# Patient Record
Sex: Female | Born: 1960 | Race: White | Hispanic: No | Marital: Married | Smoking: Former smoker
Health system: Southern US, Community
[De-identification: ages and names within clinical notes are randomized; demographics above are authoritative.]

## PROBLEM LIST (undated history)

## (undated) DIAGNOSIS — D649 Anemia, unspecified: Secondary | ICD-10-CM

## (undated) DIAGNOSIS — F32A Depression, unspecified: Secondary | ICD-10-CM

## (undated) DIAGNOSIS — F329 Major depressive disorder, single episode, unspecified: Secondary | ICD-10-CM

## (undated) HISTORY — PX: NASAL SINUS SURGERY: SHX719

## (undated) HISTORY — PX: PLACEMENT OF BREAST IMPLANTS: SHX6334

## (undated) HISTORY — PX: ECTOPIC PREGNANCY SURGERY: SHX613

---

## 2004-10-05 ENCOUNTER — Encounter: Admission: RE | Admit: 2004-10-05 | Discharge: 2004-10-05 | Payer: Self-pay | Admitting: Otolaryngology

## 2008-01-04 ENCOUNTER — Encounter: Admission: RE | Admit: 2008-01-04 | Discharge: 2008-01-04 | Payer: Self-pay | Admitting: Otolaryngology

## 2009-06-13 ENCOUNTER — Encounter: Admission: RE | Admit: 2009-06-13 | Discharge: 2009-06-13 | Payer: Self-pay | Admitting: Otolaryngology

## 2011-12-23 ENCOUNTER — Other Ambulatory Visit: Payer: Self-pay | Admitting: Otolaryngology

## 2011-12-23 DIAGNOSIS — Z1231 Encounter for screening mammogram for malignant neoplasm of breast: Secondary | ICD-10-CM

## 2012-01-10 ENCOUNTER — Ambulatory Visit
Admission: RE | Admit: 2012-01-10 | Discharge: 2012-01-10 | Disposition: A | Discharge status: Home / Self Care | Payer: BC Managed Care – PPO | Source: Ambulatory Visit | Attending: Otolaryngology | Admitting: Otolaryngology

## 2012-01-10 DIAGNOSIS — Z1231 Encounter for screening mammogram for malignant neoplasm of breast: Secondary | ICD-10-CM

## 2015-06-30 ENCOUNTER — Inpatient Hospital Stay: Admission: RE | Admit: 2015-06-30 | Payer: Self-pay | Source: Ambulatory Visit

## 2015-06-30 ENCOUNTER — Encounter: Payer: Self-pay | Admitting: *Deleted

## 2015-06-30 DIAGNOSIS — Z881 Allergy status to other antibiotic agents status: Secondary | ICD-10-CM | POA: Diagnosis not present

## 2015-06-30 DIAGNOSIS — N939 Abnormal uterine and vaginal bleeding, unspecified: Secondary | ICD-10-CM | POA: Diagnosis present

## 2015-06-30 DIAGNOSIS — N736 Female pelvic peritoneal adhesions (postinfective): Secondary | ICD-10-CM | POA: Diagnosis not present

## 2015-06-30 DIAGNOSIS — R938 Abnormal findings on diagnostic imaging of other specified body structures: Secondary | ICD-10-CM | POA: Diagnosis not present

## 2015-06-30 DIAGNOSIS — N8320 Unspecified ovarian cysts: Secondary | ICD-10-CM | POA: Diagnosis not present

## 2015-06-30 DIAGNOSIS — G4700 Insomnia, unspecified: Secondary | ICD-10-CM | POA: Diagnosis not present

## 2015-06-30 DIAGNOSIS — G8929 Other chronic pain: Secondary | ICD-10-CM | POA: Diagnosis not present

## 2015-06-30 DIAGNOSIS — F329 Major depressive disorder, single episode, unspecified: Secondary | ICD-10-CM | POA: Diagnosis not present

## 2015-06-30 DIAGNOSIS — F17210 Nicotine dependence, cigarettes, uncomplicated: Secondary | ICD-10-CM | POA: Diagnosis not present

## 2015-06-30 DIAGNOSIS — D649 Anemia, unspecified: Secondary | ICD-10-CM | POA: Diagnosis not present

## 2015-06-30 DIAGNOSIS — Z880 Allergy status to penicillin: Secondary | ICD-10-CM | POA: Diagnosis not present

## 2015-06-30 DIAGNOSIS — R102 Pelvic and perineal pain: Secondary | ICD-10-CM | POA: Diagnosis not present

## 2015-06-30 DIAGNOSIS — Z79899 Other long term (current) drug therapy: Secondary | ICD-10-CM | POA: Diagnosis not present

## 2015-06-30 DIAGNOSIS — N840 Polyp of corpus uteri: Secondary | ICD-10-CM | POA: Diagnosis not present

## 2015-06-30 NOTE — Patient Instructions (Signed)
  Your procedure is scheduled on: 07-03-15 Report to [place] To find out your arrival time please call [phone] between 1PM - 3PM on 07-02-15 [facility])  Remember: Instructions that are not followed completely may result in serious medical risk, up to and including death, or upon the discretion of your surgeon and anesthesiologist your surgery may need to be rescheduled.    _X___ 1. Do not eat food or drink liquids after midnight. No gum chewing or hard candies.     _X___ 2. No Alcohol for 24 hours before or after surgery.   ____ 3. Bring all medications with you on the day of surgery if instructed.     _X__ 4. Notify your doctor if there is any change in your medical condition     (cold, fever, infections).     Do not wear jewelry, make-up, hairpins, clips or nail polish.  Do not wear lotions, powders, or perfumes. You may wear deodorant.  Do not shave 48 hours prior to surgery. Men may shave face and neck.  Do not bring valuables to the hospital.    [facility] is not responsible for any belongings or valuables.               Contacts, dentures or bridgework may not be worn into surgery.  Leave your suitcase in the car. After surgery it may be brought to your room.  For patients admitted to the hospital, discharge time is determined by your treatment team.   Patients discharged the day of surgery will not be allowed to drive home.   Please read over the following fact sheets that you were given:    ____ Take these medicines the morning of surgery with A SIP OF WATER:    1. NONE  2.   3.   4.  5.  6.  ____ Fleet Enema (as directed)   _X___ Use CHG Soap as directed  ____ Use inhalers on the day of surgery  ____ Stop metformin 2 days prior to surgery    ____ Take 1/2 of usual insulin dose the night before surgery and none on the morning of surgery.   ____ Stop Coumadin/Plavix/aspirin-N/A  ____ Stop Anti-inflammatories-NO  NSAIDS OR ASPIRIN PRODUCTS-TYLENOL OK   _X___ Stop supplements until after surgery-STOP BIOTIN NOW   ____ Bring C-Pap to the hospital.

## 2015-07-01 ENCOUNTER — Encounter
Admission: RE | Admit: 2015-07-01 | Discharge: 2015-07-01 | Disposition: A | Discharge status: Home / Self Care | Payer: BLUE CROSS/BLUE SHIELD | Source: Ambulatory Visit | Attending: Obstetrics & Gynecology | Admitting: Obstetrics & Gynecology

## 2015-07-01 DIAGNOSIS — N840 Polyp of corpus uteri: Secondary | ICD-10-CM | POA: Diagnosis not present

## 2015-07-01 LAB — CBC
HCT: 25.1 % — ABNORMAL LOW (ref 35.0–47.0)
Hemoglobin: 7.4 g/dL — ABNORMAL LOW (ref 12.0–16.0)
MCH: 20.8 pg — ABNORMAL LOW (ref 26.0–34.0)
MCHC: 29.5 g/dL — ABNORMAL LOW (ref 32.0–36.0)
MCV: 70.5 fL — ABNORMAL LOW (ref 80.0–100.0)
Platelets: 259 10*3/uL (ref 150–440)
RBC: 3.57 MIL/uL — ABNORMAL LOW (ref 3.80–5.20)
RDW: 18.7 % — ABNORMAL HIGH (ref 11.5–14.5)
WBC: 3.8 10*3/uL (ref 3.6–11.0)

## 2015-07-01 LAB — BASIC METABOLIC PANEL
Anion gap: 6 (ref 5–15)
BUN: 13 mg/dL (ref 6–20)
CO2: 24 mmol/L (ref 22–32)
Calcium: 8.5 mg/dL — ABNORMAL LOW (ref 8.9–10.3)
Chloride: 109 mmol/L (ref 101–111)
Creatinine, Ser: 0.52 mg/dL (ref 0.44–1.00)
GFR calc Af Amer: >60 mL/min (ref >60)
GFR calc non Af Amer: >60 mL/min (ref >60)
Glucose, Bld: 101 mg/dL — ABNORMAL HIGH (ref 65–99)
Potassium: 3.7 mmol/L (ref 3.5–5.1)
Sodium: 139 mmol/L (ref 135–145)

## 2015-07-01 LAB — TYPE AND SCREEN
ABO/RH(D): O POS
Antibody Screen: NEGATIVE

## 2015-07-02 LAB — ABO/RH: ABO/RH(D): O POS

## 2015-07-03 ENCOUNTER — Encounter
Admission: RE | Disposition: A | Discharge status: Home / Self Care | Payer: Self-pay | Source: Ambulatory Visit | Attending: Obstetrics & Gynecology

## 2015-07-03 ENCOUNTER — Ambulatory Visit: Payer: BLUE CROSS/BLUE SHIELD | Admitting: Certified Registered Nurse Anesthetist

## 2015-07-03 ENCOUNTER — Encounter: Payer: Self-pay | Admitting: *Deleted

## 2015-07-03 ENCOUNTER — Ambulatory Visit
Admission: RE | Admit: 2015-07-03 | Discharge: 2015-07-03 | Disposition: A | Discharge status: Home / Self Care | Payer: BLUE CROSS/BLUE SHIELD | Source: Ambulatory Visit | Attending: Obstetrics & Gynecology | Admitting: Obstetrics & Gynecology

## 2015-07-03 DIAGNOSIS — F329 Major depressive disorder, single episode, unspecified: Secondary | ICD-10-CM | POA: Insufficient documentation

## 2015-07-03 DIAGNOSIS — Z79899 Other long term (current) drug therapy: Secondary | ICD-10-CM | POA: Insufficient documentation

## 2015-07-03 DIAGNOSIS — N840 Polyp of corpus uteri: Secondary | ICD-10-CM | POA: Diagnosis not present

## 2015-07-03 DIAGNOSIS — R938 Abnormal findings on diagnostic imaging of other specified body structures: Secondary | ICD-10-CM | POA: Insufficient documentation

## 2015-07-03 DIAGNOSIS — R102 Pelvic and perineal pain: Secondary | ICD-10-CM | POA: Insufficient documentation

## 2015-07-03 DIAGNOSIS — G8929 Other chronic pain: Secondary | ICD-10-CM | POA: Insufficient documentation

## 2015-07-03 DIAGNOSIS — N736 Female pelvic peritoneal adhesions (postinfective): Secondary | ICD-10-CM | POA: Insufficient documentation

## 2015-07-03 DIAGNOSIS — Z880 Allergy status to penicillin: Secondary | ICD-10-CM | POA: Insufficient documentation

## 2015-07-03 DIAGNOSIS — G4700 Insomnia, unspecified: Secondary | ICD-10-CM | POA: Insufficient documentation

## 2015-07-03 DIAGNOSIS — D649 Anemia, unspecified: Secondary | ICD-10-CM | POA: Insufficient documentation

## 2015-07-03 DIAGNOSIS — N939 Abnormal uterine and vaginal bleeding, unspecified: Secondary | ICD-10-CM | POA: Insufficient documentation

## 2015-07-03 DIAGNOSIS — N8320 Unspecified ovarian cysts: Secondary | ICD-10-CM | POA: Insufficient documentation

## 2015-07-03 DIAGNOSIS — Z881 Allergy status to other antibiotic agents status: Secondary | ICD-10-CM | POA: Insufficient documentation

## 2015-07-03 DIAGNOSIS — F17210 Nicotine dependence, cigarettes, uncomplicated: Secondary | ICD-10-CM | POA: Insufficient documentation

## 2015-07-03 HISTORY — PX: HYSTEROSCOPY WITH D & C: SHX1775

## 2015-07-03 HISTORY — PX: LAPAROSCOPIC OVARIAN CYSTECTOMY: SHX6248

## 2015-07-03 HISTORY — DX: Anemia, unspecified: D64.9

## 2015-07-03 HISTORY — DX: Major depressive disorder, single episode, unspecified: F32.9

## 2015-07-03 HISTORY — DX: Depression, unspecified: F32.A

## 2015-07-03 LAB — POCT PREGNANCY, URINE: PREG TEST UR: NEGATIVE

## 2015-07-03 SURGERY — DILATATION AND CURETTAGE /HYSTEROSCOPY
Anesthesia: General | Site: Vagina | Wound class: Clean Contaminated

## 2015-07-03 MED ORDER — HEPARIN SODIUM (PORCINE) 5000 UNIT/ML IJ SOLN
5000.0000 [IU] | Freq: Once | INTRAMUSCULAR | Status: AC
  Administered 2015-07-03: 5000 [IU] via SUBCUTANEOUS

## 2015-07-03 MED ORDER — TRAMADOL HCL 50 MG PO TABS: 50.0000 mg | ORAL_TABLET | Freq: Four times a day (QID) | ORAL | Status: DC | PRN

## 2015-07-03 MED ORDER — ONDANSETRON HCL 4 MG/2ML IJ SOLN
INTRAMUSCULAR | Status: DC | PRN
  Administered 2015-07-03: 4 mg via INTRAVENOUS

## 2015-07-03 MED ORDER — ONDANSETRON HCL 4 MG/2ML IJ SOLN
4.0000 mg | Freq: Once | INTRAMUSCULAR | Status: AC | PRN
  Administered 2015-07-03: 4 mg via INTRAVENOUS

## 2015-07-03 MED ORDER — LIDOCAINE HCL (CARDIAC) 20 MG/ML IV SOLN
INTRAVENOUS | Status: DC | PRN
  Administered 2015-07-03: 100 mg via INTRAVENOUS

## 2015-07-03 MED ORDER — IBUPROFEN 600 MG PO TABS: 600.0000 mg | ORAL_TABLET | Freq: Four times a day (QID) | ORAL | Status: DC | PRN

## 2015-07-03 MED ORDER — HEPARIN SODIUM (PORCINE) 5000 UNIT/ML IJ SOLN
INTRAMUSCULAR | Status: AC
  Administered 2015-07-03: 5000 [IU] via SUBCUTANEOUS
  Filled 2015-07-03: qty 1

## 2015-07-03 MED ORDER — ONDANSETRON HCL 4 MG/2ML IJ SOLN
INTRAMUSCULAR | Status: AC
  Administered 2015-07-03: 4 mg via INTRAVENOUS
  Filled 2015-07-03: qty 2

## 2015-07-03 MED ORDER — GLYCOPYRROLATE 0.2 MG/ML IJ SOLN
INTRAMUSCULAR | Status: DC | PRN
  Administered 2015-07-03: 5 mg via INTRAVENOUS

## 2015-07-03 MED ORDER — FAMOTIDINE 20 MG PO TABS
ORAL_TABLET | ORAL | Status: AC
  Administered 2015-07-03: 20 mg via ORAL
  Filled 2015-07-03: qty 1

## 2015-07-03 MED ORDER — ACETAMINOPHEN 10 MG/ML IV SOLN
INTRAVENOUS | Status: AC
  Filled 2015-07-03: qty 100

## 2015-07-03 MED ORDER — ROCURONIUM BROMIDE 100 MG/10ML IV SOLN
INTRAVENOUS | Status: DC | PRN
  Administered 2015-07-03: 30 mg via INTRAVENOUS

## 2015-07-03 MED ORDER — KETOROLAC TROMETHAMINE 30 MG/ML IJ SOLN
INTRAMUSCULAR | Status: DC | PRN
  Administered 2015-07-03: 30 mg via INTRAVENOUS

## 2015-07-03 MED ORDER — MIDAZOLAM HCL 2 MG/2ML IJ SOLN
INTRAMUSCULAR | Status: DC | PRN
  Administered 2015-07-03: 2 mg via INTRAVENOUS

## 2015-07-03 MED ORDER — FENTANYL CITRATE (PF) 100 MCG/2ML IJ SOLN
INTRAMUSCULAR | Status: AC
  Administered 2015-07-03: 25 ug via INTRAVENOUS
  Filled 2015-07-03: qty 2

## 2015-07-03 MED ORDER — NEOSTIGMINE METHYLSULFATE 10 MG/10ML IV SOLN
INTRAVENOUS | Status: DC | PRN
  Administered 2015-07-03: 3 mg via INTRAVENOUS

## 2015-07-03 MED ORDER — FENTANYL CITRATE (PF) 100 MCG/2ML IJ SOLN
INTRAMUSCULAR | Status: DC | PRN
  Administered 2015-07-03: 100 ug via INTRAVENOUS

## 2015-07-03 MED ORDER — LACTATED RINGERS IV SOLN
INTRAVENOUS | Status: DC
Start: 2015-07-03 — End: 2015-07-03
  Administered 2015-07-03 (×2): via INTRAVENOUS

## 2015-07-03 MED ORDER — FAMOTIDINE 20 MG PO TABS
20.0000 mg | ORAL_TABLET | Freq: Once | ORAL | Status: AC
  Administered 2015-07-03: 20 mg via ORAL

## 2015-07-03 MED ORDER — LIDOCAINE HCL (PF) 1 % IJ SOLN
INTRAMUSCULAR | Status: AC
  Filled 2015-07-03: qty 30

## 2015-07-03 MED ORDER — FENTANYL CITRATE (PF) 100 MCG/2ML IJ SOLN
25.0000 ug | INTRAMUSCULAR | Status: DC | PRN
  Administered 2015-07-03 (×4): 25 ug via INTRAVENOUS

## 2015-07-03 MED ORDER — PROPOFOL 10 MG/ML IV BOLUS
INTRAVENOUS | Status: DC | PRN
  Administered 2015-07-03: 150 mg via INTRAVENOUS

## 2015-07-03 MED ORDER — DEXAMETHASONE SODIUM PHOSPHATE 4 MG/ML IJ SOLN
INTRAMUSCULAR | Status: DC | PRN
  Administered 2015-07-03: 5 mg via INTRAVENOUS

## 2015-07-03 SURGICAL SUPPLY — 26 items
CATH ROBINSON RED A/P 16FR (CATHETERS) ×4 IMPLANT
CORD URO TURP 10FT (MISCELLANEOUS) ×4 IMPLANT
ELECT RESECT POWERBALL 24F (MISCELLANEOUS) ×2 IMPLANT
GLOVE BIOGEL PI IND STRL 6.5 (GLOVE) ×2 IMPLANT
GLOVE BIOGEL PI INDICATOR 6.5 (GLOVE) ×4
GLOVE SURG SYN 6.5 ES PF (GLOVE) ×16 IMPLANT
GLOVE SURG SYN 6.5 PF PI (GLOVE) ×2 IMPLANT
GOWN STRL REUS W/ TWL LRG LVL3 (GOWN DISPOSABLE) ×4 IMPLANT
GOWN STRL REUS W/TWL LRG LVL3 (GOWN DISPOSABLE) ×8
IV LACTATED RINGERS 1000ML (IV SOLUTION) ×4 IMPLANT
KIT RM TURNOVER CYSTO AR (KITS) ×4 IMPLANT
LIQUID BAND (GAUZE/BANDAGES/DRESSINGS) ×2 IMPLANT
MANIPULATOR UTERINE ZUMI 4.5 (MISCELLANEOUS) ×2 IMPLANT
NDL SPNL 22GX3.5 QUINCKE BK (NEEDLE) IMPLANT
NEEDLE SPNL 22GX3.5 QUINCKE BK (NEEDLE) IMPLANT
PACK DNC HYST (MISCELLANEOUS) ×4 IMPLANT
PAD GROUND ADULT SPLIT (MISCELLANEOUS) ×4 IMPLANT
PAD OB MATERNITY 4.3X12.25 (PERSONAL CARE ITEMS) ×4 IMPLANT
PAD PREP 24X41 OB/GYN DISP (PERSONAL CARE ITEMS) ×4 IMPLANT
SUT MNCRL AB 4-0 PS2 18 (SUTURE) ×2 IMPLANT
SYR 20CC LL (SYRINGE) IMPLANT
SYR 50ML LL SCALE MARK (SYRINGE) ×2 IMPLANT
SYRINGE 10CC LL (SYRINGE) ×4 IMPLANT
TUBING CONNECTING 10 (TUBING) ×3 IMPLANT
TUBING CONNECTING 10' (TUBING) ×1
TUBING HYSTEROSCOPY DOLPHIN (MISCELLANEOUS) ×2 IMPLANT

## 2015-07-03 NOTE — Transfer of Care (Signed)
Immediate Anesthesia Transfer of Care Note  Patient: Loretta Floyd  Procedure(s) Performed: Procedure(s): DILATATION AND CURETTAGE /HYSTEROSCOPY (N/A) LAPAROSCOPIC OVARIAN CYSTECTOMY (N/A)  Patient Location: PACU  Anesthesia Type:General  Level of Consciousness: awake and sedated  Airway & Oxygen Therapy: Patient Spontanous Breathing and Patient connected to nasal cannula oxygen  Post-op Assessment: Report given to RN and Post -op Vital signs reviewed and stable  Post vital signs: Reviewed and stable  Last Vitals:  Filed Vitals:   07/03/15 1906  BP: 130/72  Pulse:   Temp: 36.4 C  Resp: 11    Complications: No apparent anesthesia complications

## 2015-07-03 NOTE — Progress Notes (Signed)
H&P Update  Pt was last seen in my office, and complete history and physical performed.  The surgical history has been reviewed and remains accurate without interval change. The patient was re-examined and patient's physiologic condition has not changed significantly in the last 30 days.  No new pharmacological allergies or types of therapy has been initiated.  Allergies  Allergen Reactions  . Azithromycin Nausea And Vomiting  . Penicillins Rash   @CURRENTMEDS @ Past Medical History  Diagnosis Date  . Depression   . Anemia    Past Surgical History  Procedure Laterality Date  . Cesarean section    . Ectopic pregnancy surgery    . Nasal sinus surgery    . Placement of breast implants      BP 94/66 mmHg  Pulse 68  Temp(Src) 98.3 F (36.8 C) (Oral)  Resp 16  Ht 5' 2.5" (1.588 m)  Wt 53.524 kg (118 lb)  BMI 21.22 kg/m2  SpO2 100%  LMP  (Exact Date)  NAD RRR no murmurs CTAB, no wheezing, resps unlabored +BS, soft, NTTP No c/c/e Pelvic exam deferred  The above history was confirmed with the patient. The condition still exists that makes this procedure necessary. Surgical plan includes dilation and curettage with hysteroscopy, and laparoscopic ovarian cystectomy, as confirmed on the consent. The treatment plan remains the same, without new options for care.  The patient understands the potential benefits and risks and the consents have been signed and placed on the chart.     Larey Days, MD Attending Obstetrician Gynecologist [facility]

## 2015-07-03 NOTE — Anesthesia Postprocedure Evaluation (Signed)
  Anesthesia Post-op Note  Patient: Loretta Floyd  Procedure(s) Performed: Procedure(s): DILATATION AND CURETTAGE /HYSTEROSCOPY (N/A) LAPAROSCOPIC OVARIAN CYSTECTOMY (N/A)  Anesthesia type:General  Patient location: PACU  Post pain: Pain level controlled  Post assessment: Post-op Vital signs reviewed, Patient's Cardiovascular Status Stable, Respiratory Function Stable, Patent Airway and No signs of Nausea or vomiting  Post vital signs: Reviewed and stable  Last Vitals:  Filed Vitals:   07/03/15 1958  BP:   Pulse: 71  Temp:   Resp:     Level of consciousness: awake, alert  and patient cooperative  Complications: No apparent anesthesia complications

## 2015-07-03 NOTE — Anesthesia Procedure Notes (Signed)
Procedure Name: Intubation Date/Time: 07/03/2015 5:55 PM Performed by: Rosaria Ferries, Toshua Honsinger Pre-anesthesia Checklist: Patient identified, Emergency Drugs available, Suction available and Patient being monitored Patient Re-evaluated:Patient Re-evaluated prior to inductionOxygen Delivery Method: Circle system utilized Preoxygenation: Pre-oxygenation with 100% oxygen Intubation Type: IV induction Laryngoscope Size: Mac and 3 Grade View: Grade I Tube type: Oral Tube size: 7.0 mm Number of attempts: 1 Placement Confirmation: ETT inserted through vocal cords under direct vision,  positive ETCO2 and breath sounds checked- equal and bilateral Secured at: 21 cm Tube secured with: Tape

## 2015-07-03 NOTE — Anesthesia Preprocedure Evaluation (Addendum)
Anesthesia Evaluation  Patient identified by MRN, date of birth, ID band Patient awake    Reviewed: Allergy & Precautions, NPO status , Patient's Chart, lab work & pertinent test results, reviewed documented beta blocker date and time   Airway Mallampati: II  TM Distance: >3 FB     Dental  (+) Chipped   Pulmonary Current Smoker,          Cardiovascular     Neuro/Psych PSYCHIATRIC DISORDERS Depression    GI/Hepatic   Endo/Other    Renal/GU      Musculoskeletal   Abdominal   Peds  Hematology  (+) anemia ,   Anesthesia Other Findings   Reproductive/Obstetrics                            Anesthesia Physical Anesthesia Plan  ASA: II  Anesthesia Plan: General   Post-op Pain Management:    Induction: Intravenous  Airway Management Planned: Oral ETT  Additional Equipment:   Intra-op Plan:   Post-operative Plan:   Informed Consent: I have reviewed the patients History and Physical, chart, labs and discussed the procedure including the risks, benefits and alternatives for the proposed anesthesia with the patient or authorized representative who has indicated his/her understanding and acceptance.     Plan Discussed with: CRNA  Anesthesia Plan Comments:         Anesthesia Quick Evaluation

## 2015-07-03 NOTE — Discharge Instructions (Signed)
AMBULATORY SURGERY  DISCHARGE INSTRUCTIONS   1) The drugs that you were given will stay in your system until tomorrow so for the next 24 hours you should not:  A) Drive an automobile B) Make any legal decisions C) Drink any alcoholic beverage   2) You may resume regular meals tomorrow.  Today it is better to start with liquids and gradually work up to solid foods.  You may eat anything you prefer, but it is better to start with liquids, then soup and crackers, and gradually work up to solid foods.   3) Please notify your doctor immediately if you have any unusual bleeding, trouble breathing, redness and pain at the surgery site, drainage, fever, or pain not relieved by medication.    4) Additional Instructions:         Nothing in vagina for 2 weeks.  You should expect some bleeding, which should taper off in the next week or so.  Call if it does not.   You should feels some mild discomfort in the right upper abdomen and right shoulder from your surgery.  Any pain that causes you to double over is not normal and needs to be evaluated right away.     Please contact your physician with any problems or Same Day Surgery at [phone], Monday through Friday 6 am to 4 pm, or [place] at [facility] number at [phone].

## 2015-07-04 ENCOUNTER — Encounter: Payer: Self-pay | Admitting: Obstetrics & Gynecology

## 2015-07-04 NOTE — Op Note (Signed)
Loretta Floyd PROCEDURE DATE: 07/03/2015  PATIENT:  Loretta Floyd  54 y.o. female  PRE-OPERATIVE DIAGNOSIS:  ABNORMAL UTERINE BLEEDING, Pelvic pain, Ovarian cyst. POST-OPERATIVE DIAGNOSIS: same, pelvic adhesive disease  PROCEDURE:  Procedure(s): DILATATION AND CURETTAGE /HYSTEROSCOPY (N/A) LAPAROSCOPIC OVARIAN CYSTECTOMY (N/A)  SURGEON:  Surgeon(s) and Role:    * Terre Hanneman C Ennis Heavner, MD - Primary  ANESTHESIA:  General via ET  I/O  Total I/O In: 1150 [I.V.:1150] Out: 205 [Urine:200; Blood:5]  FINDINGS:  1. Uterus 10cm sound, copious fluffy endometrium, no evidence of polyp. 2. Large boggy uterus, cystic ovaries and fallopian tubes bilaterally. Adhesions of uterus to anterior abdominal wall, some pelvic adhesions.  Unable to fully evaluate pelvic structures.  Normal upper abdomen.  SPECIMEN:  1. Endometrial curettings 2. Right ovarian cyst aspirate  COMPLICATIONS: none apparent  DISPOSITION: vital signs stable to PACU   Indication for Surgery: 54 y.o. female who has had several months of constant heavy vaginal bleeding, complaints of chronic pelvic pain/pressure, and evidence on ultrasound of thickened endometrium and a 4cm right ovarian cyst.  Risks of surgery were discussed with the patient including but not limited to: bleeding which may require transfusion or reoperation; infection which may require antibiotics; injury to bowel, bladder, ureters or other surrounding organs; need for additional procedures including laparotomy, blood clot, incisional problems and other postoperative/anesthesia complications. Written informed consent was obtained.     PROCEDURE IN DETAIL:  The patient had sequential compression devices applied to her lower extremities while in the preoperative area.  She was then taken to the operating room where general anesthesia was administered and was found to be adequate.  She was placed in the dorsal lithotomy position, and was prepped and draped in  a sterile manner.  A Foley catheter was inserted into her bladder and attached to constant drainage.   A speculum was then placed in the patient's vagina and a single tooth tenaculum was applied to the anterior lip of the cervix.  A paracervical block was placed.  The uterus was sounded to 10cm. Her cervix was serially dilated to accommodate the hysteroscope with findings as above. A sharp curettage was then performed until there was a gritty texture in all four quadrants. The specimen(s) was handed off to nursing.  The camera was reinserted and confirmed the uterus had been evacuated. The tenaculum was removed from the anterior lip of the cervix and the vaginal speculum was removed after noting good hemostasis.  A uterine manipulator was then advanced into the uterus .  After a change of gloves, attention was turned to the abdomen where an umbilical incision was made with the scalpel. A Veress needle was inserted and a drop test confirmed appropriate position.  Opening pressure was 3mmHg, and the abdomen was insufflated to 15mg Hg carbon dioxide gas and adequate pneumoperitoneum was obtained. A 2mm trochar was inserted in the umbilical incision using a visiport method. A survey of the patient's pelvis and abdomen revealed the findings as mentioned above. Another 110mm port was inserted in the lower left quadrant under visualization.  The uterus was boggy and difficult to manipulate.  The right ovary was seen in its entirety, but was unable to be moved out of the pelvis due to uterine weight/position.  A sharp needle on suction was pierced into the cyst and clear colourless fluid was evacuated. The left ovary was also seen but also unable to be brought out of the pelvis.    The operative site was surveyed, and it was  found to be hemostatic.  No intraoperative injury to surrounding organs was noted.  The abdomen was desufflated and all instruments were then removed from the patient's abdomen. The uterine  manipulator was removed without complications.  All skin incisions were closed with 4-0 monocryl and covered with surgical glue. The patient tolerated the procedures well.  All instruments, needles, and sponge counts were correct x 2. The patient was taken to the recovery room in stable condition.   ---- Larey Days, MD Attending Obstetrician and Gynecologist [facility]

## 2015-07-07 LAB — SURGICAL PATHOLOGY

## 2015-07-08 LAB — CYTOLOGY - NON PAP

## 2016-12-28 NOTE — H&P (Signed)
Chief Complaint:   Chief Complaint  Patient presents with  . Abnormal uterine bleeding     HPI:  Loretta Floyd is a 56 y.o. [id number] here for female here for abnormal uterine bleeding  Location:  uterus  Quality:  heavy  Severity:  Moderate to severe  Duration:  2 months  Timing:  constant  Modifying factors: nothing   Associated signs and symptoms: denies lightheaded, dizzy, weak, no significant weight loss   Context:   Loretta Floyd had a D&C 6 months ago for same symptoms (and 4cm cyst laparoscopically) which was benign.  She is tired of the bleeding and wants it to stop.  She asks today for a hysterectomy as definitive management. She refuses long-term hormonal treatment.    Patient's last menstrual period was 10/27/2016 (approximate).     Past Medical History:  has a past medical history of Abnormal uterine bleeding, unspecified.  Past Surgical History:  has a past surgical history that includes Hysteroscopy (07/03/2015); Cesarean section; and Tubal ligation. Family History: family history is not on file. Social History:  reports that she has never smoked. She has never used smokeless tobacco. She reports that she does not drink alcohol or use drugs. OB/GYN History:  OB History    Gravida Para Term Preterm AB Living   4       1 3    SAB TAB Ectopic Molar Multiple Live Births       1     3      Allergies: is allergic to azithromycin; erythromycin base; and penicillins. Medications:  Current Outpatient Prescriptions:  .  multivitamin capsule, Take by mouth., Disp: , Rfl:  .  sertraline (ZOLOFT) 100 MG tablet, Take 150 mg by mouth once daily., Disp: , Rfl:  .  zolpidem (AMBIEN) 10 mg tablet, Take 10 mg by mouth as needed., Disp: , Rfl:  .  medroxyPROGESTERone (PROVERA) 10 MG tablet, Take 1 tablet (10 mg total) by mouth once daily., Disp: 10 tablet, Rfl: 0  Review of Systems: General:   No fatigue or weight loss Eyes:   No vision changes Ears:   No hearing  difficulty Respiratory:                No cough or shortness of breath Pulmonary:   No asthma or shortness of breath Cardiovascular:        No chest pain, palpitations, dyspnea on exertion Gastrointestinal:          No abdominal bloating, chronic diarrhea, constipations, masses, pain or hematochezia Genitourinary:  No hematuria, dysuria, abnormal vaginal discharge, pelvic pain Lymphatic:  No swollen lymph nodes Musculoskeletal: No muscle weakness Neurologic:  No extremity weakness, syncope, seizure disorder Psychiatric:  No history of depression, delusions or suicidal/homicidal ideation    Exam:   BP 127/76   Pulse 73   Ht 158.8 cm (5' 2.5")   Wt 53.2 kg (117 lb 3.2 oz)   LMP 10/27/2016 (Approximate) Comment: bleeding non stop since thanksgiving.  BMI 21.09 kg/m   Body mass index is 21.09 kg/m.  WDWN white female in NAD   Lungs: CTA, normal respiratory effort CV : RRR without murmur   Abdomen: soft , no mass, normal active bowel sounds,  non-tender, no rebound tenderness Pelvic: refused by patient  Impression:   The encounter diagnosis was Abnormal uterine bleeding (AUB), unspecified.    Plan:   - Loretta Floyd has delayed seeking treatment and is sure she is ready for a hysterectomy.  Per my op note  last year, her uterus is big and boggy. I will attempt laparoscopic removal, perhaps LAVH if I am not able to secure the colpotomy due to size.  Hopefully this will not be an issue.  Removal may be difficult due to size vs small pelvis.  May need to remove via vagina. morcillation.  - will pre-op sometime this week, plan surgery on Friday. - The patient and I discussed the technical aspects of the procedure including the potential for risks and complications.These include but are not limited to the risk of infection requiring post-operative antibiotics or further procedures.We talked about the risk of injury to adjacent organs including bladder, bowel, ureter, blood vessels or  nerves, the need to convert to an open incision, possibleneed for blood transfusion, and postop complications such asthromboembolic or cardiopulmonary complications.All of her questions were answered. Consents signed.

## 2016-12-30 ENCOUNTER — Encounter
Admission: RE | Admit: 2016-12-30 | Discharge: 2016-12-30 | Disposition: A | Discharge status: Home / Self Care | Payer: BLUE CROSS/BLUE SHIELD | Source: Ambulatory Visit | Attending: Obstetrics & Gynecology | Admitting: Obstetrics & Gynecology

## 2016-12-30 DIAGNOSIS — Z01818 Encounter for other preprocedural examination: Secondary | ICD-10-CM | POA: Insufficient documentation

## 2016-12-30 DIAGNOSIS — N939 Abnormal uterine and vaginal bleeding, unspecified: Secondary | ICD-10-CM

## 2016-12-30 DIAGNOSIS — N838 Other noninflammatory disorders of ovary, fallopian tube and broad ligament: Secondary | ICD-10-CM | POA: Diagnosis not present

## 2016-12-30 DIAGNOSIS — F17200 Nicotine dependence, unspecified, uncomplicated: Secondary | ICD-10-CM | POA: Diagnosis not present

## 2016-12-30 DIAGNOSIS — N800 Endometriosis of uterus: Secondary | ICD-10-CM | POA: Diagnosis not present

## 2016-12-30 DIAGNOSIS — N72 Inflammatory disease of cervix uteri: Secondary | ICD-10-CM | POA: Diagnosis not present

## 2016-12-30 DIAGNOSIS — F329 Major depressive disorder, single episode, unspecified: Secondary | ICD-10-CM | POA: Diagnosis not present

## 2016-12-30 DIAGNOSIS — Z79899 Other long term (current) drug therapy: Secondary | ICD-10-CM | POA: Diagnosis not present

## 2016-12-30 DIAGNOSIS — Z01810 Encounter for preprocedural cardiovascular examination: Secondary | ICD-10-CM

## 2016-12-30 DIAGNOSIS — D259 Leiomyoma of uterus, unspecified: Secondary | ICD-10-CM | POA: Diagnosis not present

## 2016-12-30 LAB — CBC
HCT: 33.6 % — ABNORMAL LOW (ref 35.0–47.0)
HEMOGLOBIN: 11.5 g/dL — AB (ref 12.0–16.0)
MCH: 29.8 pg (ref 26.0–34.0)
MCHC: 34.2 g/dL (ref 32.0–36.0)
MCV: 87.3 fL (ref 80.0–100.0)
Platelets: 284 10*3/uL (ref 150–440)
RBC: 3.85 MIL/uL (ref 3.80–5.20)
RDW: 14.5 % (ref 11.5–14.5)
WBC: 4.2 10*3/uL (ref 3.6–11.0)

## 2016-12-30 LAB — BASIC METABOLIC PANEL
ANION GAP: 6 (ref 5–15)
BUN: 15 mg/dL (ref 6–20)
CHLORIDE: 107 mmol/L (ref 101–111)
CO2: 25 mmol/L (ref 22–32)
Calcium: 9.1 mg/dL (ref 8.9–10.3)
Creatinine, Ser: 0.74 mg/dL (ref 0.44–1.00)
GFR calc non Af Amer: >60 mL/min (ref >60)
Glucose, Bld: 83 mg/dL (ref 65–99)
POTASSIUM: 3.8 mmol/L (ref 3.5–5.1)
SODIUM: 138 mmol/L (ref 135–145)

## 2016-12-30 LAB — TYPE AND SCREEN
ABO/RH(D): O POS
ANTIBODY SCREEN: NEGATIVE

## 2016-12-30 MED ORDER — CLINDAMYCIN PHOSPHATE 900 MG/50ML IV SOLN
900.0000 mg | Freq: Once | INTRAVENOUS | Status: AC
  Administered 2016-12-31: 900 mg via INTRAVENOUS

## 2016-12-30 MED ORDER — GENTAMICIN SULFATE 40 MG/ML IJ SOLN
1.5000 mg/kg | Freq: Once | INTRAVENOUS | Status: DC
  Filled 2016-12-30: qty 2

## 2016-12-30 NOTE — Patient Instructions (Signed)
Your procedure is scheduled on: TOMORROW Report to [place]. 2ND FLOOR MEDICAL MALL ENTRANCE. AT 9:15 AM .  Remember: Instructions that are not followed completely may result in serious medical risk, up to and including death, or upon the discretion of your surgeon and anesthesiologist your surgery may need to be rescheduled.    __X__ 1. Do not eat food or drink liquids after midnight. No gum chewing or hard candies.     __X__ 2. No Alcohol for 24 hours before or after surgery.   ____ 3. Bring all medications with you on the day of surgery if instructed.    __X__ 4. Notify your doctor if there is any change in your medical condition     (cold, fever, infections).             __X___5. No smoking within 24 hours of your surgery.     Do not wear jewelry, make-up, hairpins, clips or nail polish.  Do not wear lotions, powders, or perfumes.   Do not shave 48 hours prior to surgery. Men may shave face and neck.  Do not bring valuables to the hospital.    [facility] is not responsible for any belongings or valuables.               Contacts, dentures or bridgework may not be worn into surgery.  Leave your suitcase in the car. After surgery it may be brought to your room.  For patients admitted to the hospital, discharge time is determined by your                treatment team.   Patients discharged the day of surgery will not be allowed to drive home.   Please read over the following fact sheets that you were given:   Pain Booklet and MRSA Information   ____ Take these medicines the morning of surgery with A SIP OF WATER:    1. NONE  2.   3.   4.  5.  6.  ____ Fleet Enema (as directed)   __X__ Use CHG Soap as directed  ____ Use inhalers on the day of surgery  ____ Stop metformin 2 days prior to surgery    ____ Take 1/2 of usual insulin dose the night before surgery and none on the morning of surgery.   ____ Stop Coumadin/Plavix/aspirin on   __X__ Stop  Anti-inflammatories such as Advil, Aleve, Ibuprofen, Motrin, Naproxen, Naprosyn, Goodies,powder, or aspirin products.  OK to take Tylenol.   ____ Stop supplements until after surgery.    ____ Bring C-Pap to the hospital.

## 2016-12-31 ENCOUNTER — Ambulatory Visit: Payer: BLUE CROSS/BLUE SHIELD | Admitting: Anesthesiology

## 2016-12-31 ENCOUNTER — Ambulatory Visit
Admission: RE | Admit: 2016-12-31 | Discharge: 2016-12-31 | Disposition: A | Discharge status: Home / Self Care | Payer: BLUE CROSS/BLUE SHIELD | Source: Ambulatory Visit | Attending: Obstetrics & Gynecology | Admitting: Obstetrics & Gynecology

## 2016-12-31 ENCOUNTER — Encounter
Admission: RE | Disposition: A | Discharge status: Home / Self Care | Payer: Self-pay | Source: Ambulatory Visit | Attending: Obstetrics & Gynecology

## 2016-12-31 DIAGNOSIS — N838 Other noninflammatory disorders of ovary, fallopian tube and broad ligament: Secondary | ICD-10-CM | POA: Insufficient documentation

## 2016-12-31 DIAGNOSIS — N72 Inflammatory disease of cervix uteri: Secondary | ICD-10-CM | POA: Insufficient documentation

## 2016-12-31 DIAGNOSIS — N800 Endometriosis of uterus: Secondary | ICD-10-CM | POA: Insufficient documentation

## 2016-12-31 DIAGNOSIS — F17200 Nicotine dependence, unspecified, uncomplicated: Secondary | ICD-10-CM | POA: Insufficient documentation

## 2016-12-31 DIAGNOSIS — Z79899 Other long term (current) drug therapy: Secondary | ICD-10-CM | POA: Insufficient documentation

## 2016-12-31 DIAGNOSIS — D259 Leiomyoma of uterus, unspecified: Secondary | ICD-10-CM | POA: Insufficient documentation

## 2016-12-31 DIAGNOSIS — N939 Abnormal uterine and vaginal bleeding, unspecified: Secondary | ICD-10-CM | POA: Diagnosis not present

## 2016-12-31 DIAGNOSIS — F329 Major depressive disorder, single episode, unspecified: Secondary | ICD-10-CM | POA: Insufficient documentation

## 2016-12-31 HISTORY — PX: LAPAROSCOPIC LYSIS OF ADHESIONS: SHX5905

## 2016-12-31 HISTORY — PX: LAPAROSCOPIC HYSTERECTOMY: SHX1926

## 2016-12-31 HISTORY — PX: LAPAROSCOPIC UNILATERAL SALPINGECTOMY: SHX5934

## 2016-12-31 LAB — POCT PREGNANCY, URINE: Preg Test, Ur: NEGATIVE

## 2016-12-31 SURGERY — HYSTERECTOMY, TOTAL, LAPAROSCOPIC
Anesthesia: General | Wound class: Clean Contaminated

## 2016-12-31 MED ORDER — ROCURONIUM BROMIDE 50 MG/5ML IV SOSY
PREFILLED_SYRINGE | INTRAVENOUS | Status: AC
  Filled 2016-12-31: qty 5

## 2016-12-31 MED ORDER — GABAPENTIN 300 MG PO CAPS
300.0000 mg | ORAL_CAPSULE | Freq: Once | ORAL | Status: AC
  Administered 2016-12-31: 300 mg via ORAL

## 2016-12-31 MED ORDER — SCOPOLAMINE 1 MG/3DAYS TD PT72
MEDICATED_PATCH | TRANSDERMAL | Status: AC
  Filled 2016-12-31: qty 1

## 2016-12-31 MED ORDER — LIDOCAINE HCL (CARDIAC) 20 MG/ML IV SOLN
INTRAVENOUS | Status: DC | PRN
  Administered 2016-12-31: 100 mg via INTRAVENOUS

## 2016-12-31 MED ORDER — OXYCODONE HCL 5 MG/5ML PO SOLN: 5.0000 mg | Freq: Once | ORAL | Status: AC | PRN

## 2016-12-31 MED ORDER — FAMOTIDINE 20 MG PO TABS
20.0000 mg | ORAL_TABLET | Freq: Once | ORAL | Status: AC
Start: 2016-12-31 — End: 2016-12-31
  Administered 2016-12-31: 20 mg via ORAL

## 2016-12-31 MED ORDER — ACETAMINOPHEN 10 MG/ML IV SOLN
INTRAVENOUS | Status: DC | PRN
  Administered 2016-12-31: 1000 mg via INTRAVENOUS

## 2016-12-31 MED ORDER — CELECOXIB 200 MG PO CAPS
200.0000 mg | ORAL_CAPSULE | Freq: Once | ORAL | Status: AC
  Administered 2016-12-31: 200 mg via ORAL

## 2016-12-31 MED ORDER — FENTANYL CITRATE (PF) 250 MCG/5ML IJ SOLN
INTRAMUSCULAR | Status: AC
  Filled 2016-12-31: qty 5

## 2016-12-31 MED ORDER — FAMOTIDINE 20 MG PO TABS
ORAL_TABLET | ORAL | Status: AC
  Administered 2016-12-31: 20 mg via ORAL
  Filled 2016-12-31: qty 1

## 2016-12-31 MED ORDER — FENTANYL CITRATE (PF) 100 MCG/2ML IJ SOLN
25.0000 ug | INTRAMUSCULAR | Status: DC | PRN
  Administered 2016-12-31 (×4): 25 ug via INTRAVENOUS

## 2016-12-31 MED ORDER — ACETAMINOPHEN NICU IV SYRINGE 10 MG/ML
INTRAVENOUS | Status: AC
  Filled 2016-12-31: qty 1

## 2016-12-31 MED ORDER — SUGAMMADEX SODIUM 200 MG/2ML IV SOLN
INTRAVENOUS | Status: DC | PRN
  Administered 2016-12-31: 100 mg via INTRAVENOUS

## 2016-12-31 MED ORDER — HEPARIN SODIUM (PORCINE) 5000 UNIT/ML IJ SOLN
5000.0000 [IU] | Freq: Once | INTRAMUSCULAR | Status: AC
  Administered 2016-12-31: 5000 [IU] via SUBCUTANEOUS

## 2016-12-31 MED ORDER — ONDANSETRON HCL 4 MG/2ML IJ SOLN
INTRAMUSCULAR | Status: DC | PRN
  Administered 2016-12-31: 4 mg via INTRAVENOUS

## 2016-12-31 MED ORDER — MIDAZOLAM HCL 2 MG/2ML IJ SOLN
INTRAMUSCULAR | Status: AC
  Filled 2016-12-31: qty 2

## 2016-12-31 MED ORDER — OXYCODONE HCL 5 MG PO TABS: 5.0000 mg | ORAL_TABLET | ORAL | 0 refills | Status: DC | PRN

## 2016-12-31 MED ORDER — PROPOFOL 10 MG/ML IV BOLUS
INTRAVENOUS | Status: AC
  Filled 2016-12-31: qty 20

## 2016-12-31 MED ORDER — OXYCODONE HCL 5 MG PO TABS
ORAL_TABLET | ORAL | Status: AC
  Filled 2016-12-31: qty 1

## 2016-12-31 MED ORDER — LACTATED RINGERS IV SOLN
INTRAVENOUS | Status: DC
  Administered 2016-12-31 (×2): via INTRAVENOUS

## 2016-12-31 MED ORDER — ROCURONIUM BROMIDE 100 MG/10ML IV SOLN
INTRAVENOUS | Status: DC | PRN
  Administered 2016-12-31: 50 mg via INTRAVENOUS
  Administered 2016-12-31: 20 mg via INTRAVENOUS

## 2016-12-31 MED ORDER — PROPOFOL 10 MG/ML IV BOLUS
INTRAVENOUS | Status: DC | PRN
  Administered 2016-12-31: 100 mg via INTRAVENOUS

## 2016-12-31 MED ORDER — FENTANYL CITRATE (PF) 100 MCG/2ML IJ SOLN
INTRAMUSCULAR | Status: DC | PRN
  Administered 2016-12-31: 50 ug via INTRAVENOUS
  Administered 2016-12-31: 100 ug via INTRAVENOUS

## 2016-12-31 MED ORDER — LIDOCAINE HCL (PF) 2 % IJ SOLN
INTRAMUSCULAR | Status: AC
  Filled 2016-12-31: qty 2

## 2016-12-31 MED ORDER — ACETAMINOPHEN 500 MG PO TABS: 1000.0000 mg | ORAL_TABLET | Freq: Four times a day (QID) | ORAL | 0 refills | Status: DC

## 2016-12-31 MED ORDER — DEXAMETHASONE SODIUM PHOSPHATE 10 MG/ML IJ SOLN
INTRAMUSCULAR | Status: DC | PRN
  Administered 2016-12-31: 10 mg via INTRAVENOUS

## 2016-12-31 MED ORDER — MIDAZOLAM HCL 2 MG/2ML IJ SOLN
INTRAMUSCULAR | Status: DC | PRN
  Administered 2016-12-31: 2 mg via INTRAVENOUS

## 2016-12-31 MED ORDER — GENTAMICIN IN SALINE 1.6-0.9 MG/ML-% IV SOLN
80.0000 mg | Freq: Once | INTRAVENOUS | Status: AC
  Administered 2016-12-31: 80 mg via INTRAVENOUS
  Filled 2016-12-31: qty 50

## 2016-12-31 MED ORDER — HEPARIN SODIUM (PORCINE) 5000 UNIT/ML IJ SOLN
INTRAMUSCULAR | Status: AC
  Administered 2016-12-31: 5000 [IU] via SUBCUTANEOUS
  Filled 2016-12-31: qty 1

## 2016-12-31 MED ORDER — KETOROLAC TROMETHAMINE 30 MG/ML IJ SOLN
INTRAMUSCULAR | Status: AC
  Filled 2016-12-31: qty 1

## 2016-12-31 MED ORDER — KETOROLAC TROMETHAMINE 30 MG/ML IJ SOLN
INTRAMUSCULAR | Status: DC | PRN
  Administered 2016-12-31: 30 mg via INTRAVENOUS

## 2016-12-31 MED ORDER — ONDANSETRON HCL 4 MG/2ML IJ SOLN
INTRAMUSCULAR | Status: AC
  Filled 2016-12-31: qty 2

## 2016-12-31 MED ORDER — SCOPOLAMINE 1 MG/3DAYS TD PT72
1.0000 | MEDICATED_PATCH | Freq: Once | TRANSDERMAL | Status: DC
  Administered 2016-12-31: 1.5 mg via TRANSDERMAL

## 2016-12-31 MED ORDER — FENTANYL CITRATE (PF) 100 MCG/2ML IJ SOLN
INTRAMUSCULAR | Status: AC
  Administered 2016-12-31: 25 ug via INTRAVENOUS
  Filled 2016-12-31: qty 2

## 2016-12-31 MED ORDER — BUPIVACAINE HCL (PF) 0.5 % IJ SOLN
INTRAMUSCULAR | Status: AC
  Filled 2016-12-31: qty 30

## 2016-12-31 MED ORDER — DEXAMETHASONE SODIUM PHOSPHATE 10 MG/ML IJ SOLN
INTRAMUSCULAR | Status: AC
  Filled 2016-12-31: qty 1

## 2016-12-31 MED ORDER — OXYCODONE HCL 5 MG PO TABS
5.0000 mg | ORAL_TABLET | Freq: Once | ORAL | Status: AC | PRN
  Administered 2016-12-31: 5 mg via ORAL

## 2016-12-31 MED ORDER — SUGAMMADEX SODIUM 200 MG/2ML IV SOLN
INTRAVENOUS | Status: AC
  Filled 2016-12-31: qty 2

## 2016-12-31 MED ORDER — GABAPENTIN 300 MG PO CAPS
ORAL_CAPSULE | ORAL | Status: AC
  Administered 2016-12-31: 300 mg via ORAL
  Filled 2016-12-31: qty 1

## 2016-12-31 MED ORDER — IBUPROFEN 600 MG PO TABS: 600.0000 mg | ORAL_TABLET | Freq: Four times a day (QID) | ORAL | 1 refills | Status: DC

## 2016-12-31 MED ORDER — BUPIVACAINE HCL 0.5 % IJ SOLN
INTRAMUSCULAR | Status: DC | PRN
  Administered 2016-12-31: 10 mL

## 2016-12-31 MED ORDER — CLINDAMYCIN PHOSPHATE 900 MG/50ML IV SOLN
INTRAVENOUS | Status: AC
  Filled 2016-12-31: qty 50

## 2016-12-31 MED ORDER — EPHEDRINE 5 MG/ML INJ
INTRAVENOUS | Status: AC
  Filled 2016-12-31: qty 10

## 2016-12-31 MED ORDER — CELECOXIB 200 MG PO CAPS
ORAL_CAPSULE | ORAL | Status: AC
  Administered 2016-12-31: 200 mg via ORAL
  Filled 2016-12-31: qty 1

## 2016-12-31 SURGICAL SUPPLY — 57 items
BACTOSHIELD CHG 4% 4OZ (MISCELLANEOUS) ×1
BAG URO DRAIN 2000ML W/SPOUT (MISCELLANEOUS) ×5 IMPLANT
BLADE SURG SZ11 CARB STEEL (BLADE) ×5 IMPLANT
CANISTER SUCT 1200ML W/VALVE (MISCELLANEOUS) ×5 IMPLANT
CATH FOLEY 2WAY  5CC 16FR (CATHETERS) ×1
CATH FOLEY 2WAY 5CC 16FR (CATHETERS) ×4
CATH URTH 16FR FL 2W BLN LF (CATHETERS) ×4 IMPLANT
CHLORAPREP W/TINT 26ML (MISCELLANEOUS) ×10 IMPLANT
DEFOGGER SCOPE WARMER CLEARIFY (MISCELLANEOUS) ×5 IMPLANT
DRAPE LEGGINS SURG 28X43 STRL (DRAPES) ×5 IMPLANT
DRAPE SHEET LG 3/4 BI-LAMINATE (DRAPES) ×5 IMPLANT
DRAPE UNDER BUTTOCK W/FLU (DRAPES) ×5 IMPLANT
GLOVE PI ORTHOPRO 6.5 (GLOVE) ×1
GLOVE PI ORTHOPRO STRL 6.5 (GLOVE) ×4 IMPLANT
GLOVE PROTEXIS LATEX SZ 7.5 (GLOVE) ×15 IMPLANT
GLOVE SURG LATEX 7.5 PF (GLOVE) IMPLANT
GLOVE SURG SYN 6.5 ES PF (GLOVE) ×15 IMPLANT
GLOVE SURG SYN 6.5 PF PI (GLOVE) ×9 IMPLANT
GOWN STRL REUS W/ TWL LRG LVL3 (GOWN DISPOSABLE) ×12 IMPLANT
GOWN STRL REUS W/ TWL XL LVL3 (GOWN DISPOSABLE) ×4 IMPLANT
GOWN STRL REUS W/TWL LRG LVL3 (GOWN DISPOSABLE) ×15
GOWN STRL REUS W/TWL XL LVL3 (GOWN DISPOSABLE) ×5
HANDLE YANKAUER SUCT BULB TIP (MISCELLANEOUS) ×2 IMPLANT
IRRIGATION STRYKERFLOW (MISCELLANEOUS) ×4 IMPLANT
IRRIGATOR STRYKERFLOW (MISCELLANEOUS) ×5
IV LACTATED RINGERS 1000ML (IV SOLUTION) ×5 IMPLANT
KIT PINK PAD W/HEAD ARE REST (MISCELLANEOUS) ×5
KIT PINK PAD W/HEAD ARM REST (MISCELLANEOUS) ×4 IMPLANT
KIT RM TURNOVER CYSTO AR (KITS) ×5 IMPLANT
L-HOOK LAP DISP 36CM (ELECTROSURGICAL) ×10
LABEL OR SOLS (LABEL) IMPLANT
LHOOK LAP DISP 36CM (ELECTROSURGICAL) ×5 IMPLANT
LIGASURE BLUNT 5MM 37CM (INSTRUMENTS) ×5 IMPLANT
LIQUID BAND (GAUZE/BANDAGES/DRESSINGS) ×5 IMPLANT
MANIPULATOR VCARE LG CRV RETR (MISCELLANEOUS) IMPLANT
MANIPULATOR VCARE SML CRV RETR (MISCELLANEOUS) ×5 IMPLANT
MANIPULATOR VCARE STD CRV RETR (MISCELLANEOUS) IMPLANT
NS IRRIG 500ML POUR BTL (IV SOLUTION) ×5 IMPLANT
PACK LAP CHOLECYSTECTOMY (MISCELLANEOUS) ×5 IMPLANT
PAD OB MATERNITY 4.3X12.25 (PERSONAL CARE ITEMS) ×5 IMPLANT
PAD PREP 24X41 OB/GYN DISP (PERSONAL CARE ITEMS) ×5 IMPLANT
PENCIL ELECTRO HAND CTR (MISCELLANEOUS) ×7 IMPLANT
SCRUB CHG 4% DYNA-HEX 4OZ (MISCELLANEOUS) ×4 IMPLANT
SET CYSTO W/LG BORE CLAMP LF (SET/KITS/TRAYS/PACK) IMPLANT
SET YANKAUER POOLE SUCT (MISCELLANEOUS) ×5 IMPLANT
SLEEVE ENDOPATH XCEL 5M (ENDOMECHANICALS) ×10 IMPLANT
SURGILUBE 2OZ TUBE FLIPTOP (MISCELLANEOUS) ×5 IMPLANT
SUT MNCRL 4-0 (SUTURE) ×5
SUT MNCRL 4-0 27XMFL (SUTURE) ×4
SUT MNCRL AB 4-0 PS2 18 (SUTURE) ×5 IMPLANT
SUT VIC AB 0 CT1 36 (SUTURE) ×10 IMPLANT
SUT VIC AB 4-0 PS2 18 (SUTURE) ×5 IMPLANT
SUTURE MNCRL 4-0 27XMF (SUTURE) ×4 IMPLANT
TROCAR XCEL NON-BLD 5MMX100MML (ENDOMECHANICALS) ×5 IMPLANT
TUBING CONNECTING 10 (TUBING) ×2 IMPLANT
TUBING INSUF HEATED (TUBING) ×5 IMPLANT
TUBING INSUFFLATOR HI FLOW (MISCELLANEOUS) ×5 IMPLANT

## 2016-12-31 NOTE — Interval H&P Note (Signed)
History and Physical Interval Note:  12/31/2016 9:56 AM  Loretta Floyd  has presented today for surgery, with the diagnosis of AUB  The various methods of treatment have been discussed with the patient and family. After consideration of risks, benefits and other options for treatment, the patient has consented to  Procedure(s): HYSTERECTOMY TOTAL LAPAROSCOPIC (N/A) LAPAROSCOPIC BILATERAL SALPINGECTOMY (Bilateral) as a surgical intervention .  The patient's history has been reviewed, patient examined, no change in status, stable for surgery.  I have reviewed the patient's chart and labs.  Questions were answered to the patient's satisfaction.     [place]

## 2016-12-31 NOTE — Pre-Procedure Instructions (Signed)
Hgb & hct sent to Anesthesia and Dr. Leonides Schanz for review.

## 2016-12-31 NOTE — Discharge Instructions (Signed)
Discharge instructions:  °Call office if you have any of the following: fever >101 F, chills, excessive vaginal bleeding, incision drainage or problems, leg pain or redness, or any other concerns.  ° °Activity: Do not lift > 10 lbs for 8 weeks.  °No intercourse or tampons for 8 weeks.  °No driving for 1-2 weeks.  ° °You may feel some pain in your upper right abdomen/rib and right shoulder.  This is from the gas in the abdomen for surgery. This will subside over time, please be patient! ° °Take 600mg Ibuprofen and 1000mg Tylenol around the clock, every 6 hours for at least the first 3-5 days.  After this you can take as needed.  This will help decrease inflammation and promote healing.  The narcotics you'll take just as needed, as they just trick your brain into thinking its not in pain.   ° °Please don't limit yourself in terms of routine activity.  You will be able to do most things, although they may take longer to do or be a little painful.  You can do it! ° °Don't be a hero, but don't be a wimp either!  ° ° °AMBULATORY SURGERY  °DISCHARGE INSTRUCTIONS ° ° °1) The drugs that you were given will stay in your system until tomorrow so for the next 24 hours you should not: ° °A) Drive an automobile °B) Make any legal decisions °C) Drink any alcoholic beverage ° ° °2) You may resume regular meals tomorrow.  Today it is better to start with liquids and gradually work up to solid foods. ° °You may eat anything you prefer, but it is better to start with liquids, then soup and crackers, and gradually work up to solid foods. ° ° °3) Please notify your doctor immediately if you have any unusual bleeding, trouble breathing, redness and pain at the surgery site, drainage, fever, or pain not relieved by medication. ° ° ° °4) Additional Instructions: ° ° ° ° ° ° ° °Please contact your physician with any problems or Same Day Surgery at [phone], Monday through Friday 6 am to 4 pm, or [place] at [place] Main number at  [phone]. ° °

## 2016-12-31 NOTE — Anesthesia Post-op Follow-up Note (Cosign Needed)
Anesthesia QCDR form completed.        

## 2016-12-31 NOTE — Op Note (Signed)
Total Laparoscopic Hysterectomy Operative Note Procedure Date: 12/31/2016  Patient:  Loretta Floyd  56 y.o. female  PRE-OPERATIVE DIAGNOSIS:  AUB  POST-OPERATIVE DIAGNOSIS:  abnormal uterine bleeding  PROCEDURE:  Procedure(s): HYSTERECTOMY TOTAL LAPAROSCOPIC (N/A) LAPAROSCOPIC LYSIS OF ADHESIONS LAPAROSCOPIC UNILATERAL SALPINGECTOMY (Left)  SURGEON:  Surgeon(s) and Role:    * Leondro Coryell C Christasia Angeletti, MD - Primary  ANESTHESIA:  General via ET  I/O  Total I/O In: 700 [I.V.:700] Out: 170 [Urine:150; Blood:20]  FINDINGS:  Fibroid uterus with thick scar tissue from anterior abdominal wall (previous cesarean x 3), normal bilateral ovaries and previously ligated fallopian tubes. No normal appearing right fallopian tube (no fimbriae).  Normal upper abdomen.  SPECIMEN: Uterus, Cervix, and bilateral fallopian tubes  COMPLICATIONS: none apparent  DISPOSITION: vital signs stable to PACU  Indication for Surgery: 56 y.o. [id number] with known fibroid uterus, abnormal uterine bleeding, 2nd episode after D&C last year.  Desired definitive solution.    Risks of surgery were discussed with the patient including but not limited to: bleeding which may require transfusion or reoperation; infection which may require antibiotics; injury to bowel, bladder, ureters or other surrounding organs; need for additional procedures including laparotomy, blood clot, incisional problems and other postoperative/anesthesia complications. Written informed consent was obtained.      PROCEDURE IN DETAIL:  The patient had 5000u Heparin Sub-q and sequential compression devices applied to her lower extremities while in the preoperative area.  She was then taken to the operating room. IV antibiotics were given. General anesthesia was administered via endotracheal route.  She was placed in the dorsal lithotomy position, and was prepped and draped in a sterile manner. A surgical time-out was performed.  A Foley catheter was  inserted into her bladder and attached to constant drainage and a V-Care uterine manipulator was then advanced into the uterus and a good fit around the cervix was noted. The gloves were changed, and attention was turned to the abdomen where an umbilical incision was made with the scalpel.  A 62mm trochar was inserted in the umbilical incision using a visiport method.Opening pressure was 42mmHg, and the abdomen was insufflated to 40mmHg with carbon dioxide gas and adequate pneumoperitoneum was obtained. A survey of the patient's pelvis and abdomen revealed the findings as mentioned above. Two 83mm ports were inserted in the lower left and right quadrants under visualization.    The right utero-ovarian ligament was cauterized and transected with the Ligasure.  The fimbriated end of the left fallopian tube was divided along the mesosalpinx to its previously transected stump.The bilateral round ligaments were transected.  The scar tissue connecting the anterior of the uterus to the anterior abdominal wall was divided near the uterus, and incorporated into the bladder flap with the vesicouterine peritoneum. The bladder was pushed away from the uterus. The bilateral uterine arteries were skeletonized, ligated and transected. The bilateral uterosacral and cardinal ligaments were ligated and transected. A colpotomy was made around the V-Care cervical cup and the uterus, cervix, and bilateral tubes were removed through the vagina. The vaginal cuff was closed vaginally using 0-Vicryl in a running locking stitch, with attention paid to incorporate the uterosacral ligaments in a modified McCall procedure. This was tested for integrity using the surgeon's finger. After a change of gloves, the pneumoperitoneum was recreated and surgical site inspected, and found to be hemostatic. Bilateral ureters were visualized vermiuclating. No intraoperative injury to surrounding organs was noted. The abdomen was desufflated and  all instruments were then removed.   All  skin incisions were closed with 4-0 monocryl and covered with surgical glue. The patient tolerated the procedures well.  All instruments, needles, and sponge counts were correct x 2. The patient was taken to the recovery room in stable condition.   ---- Larey Days, MD Attending Obstetrician and [facility]

## 2016-12-31 NOTE — Transfer of Care (Signed)
Immediate Anesthesia Transfer of Care Note  Patient: Loretta Floyd  Procedure(s) Performed: Procedure(s): HYSTERECTOMY TOTAL LAPAROSCOPIC (N/A) LAPAROSCOPIC LYSIS OF ADHESIONS LAPAROSCOPIC UNILATERAL SALPINGECTOMY (Left)  Patient Location: PACU  Anesthesia Type:General  Level of Consciousness: awake, alert , oriented and patient cooperative  Airway & Oxygen Therapy: Patient Spontanous Breathing and Patient connected to face mask oxygen  Post-op Assessment: Report given to RN, Post -op Vital signs reviewed and stable and Patient moving all extremities X 4  Post vital signs: Reviewed and stable  Last Vitals:  Vitals:   12/31/16 0923 12/31/16 1003  BP: 127/76   Pulse: 71   Resp: 18   Temp: 36.7 C 36.8 C    Last Pain:  Vitals:   12/31/16 1003  TempSrc: Tympanic         Complications: No apparent anesthesia complications

## 2016-12-31 NOTE — Anesthesia Preprocedure Evaluation (Signed)
Anesthesia Evaluation  Patient identified by MRN, date of birth, ID band Patient awake    Reviewed: Allergy & Precautions, H&P , NPO status , Patient's Chart, lab work & pertinent test results  History of Anesthesia Complications Negative for: history of anesthetic complications  Airway Mallampati: II  TM Distance: >3 FB Neck ROM: full    Dental no notable dental hx. (+) Poor Dentition, Chipped   Pulmonary neg shortness of breath, Current Smoker,    Pulmonary exam normal breath sounds clear to auscultation       Cardiovascular Exercise Tolerance: Good (-) angina(-) Past MI negative cardio ROS Normal cardiovascular exam Rhythm:regular Rate:Normal     Neuro/Psych PSYCHIATRIC DISORDERS Depression negative neurological ROS  negative psych ROS   GI/Hepatic negative GI ROS, Neg liver ROS, neg GERD  ,  Endo/Other  negative endocrine ROS  Renal/GU      Musculoskeletal   Abdominal   Peds  Hematology negative hematology ROS (+)   Anesthesia Other Findings Past Medical History: No date: Anemia No date: Depression  Past Surgical History: No date: CESAREAN SECTION No date: ECTOPIC PREGNANCY SURGERY 07/03/2015: HYSTEROSCOPY W/D&C N/A     Comment: Procedure: DILATATION AND CURETTAGE               /HYSTEROSCOPY;  Surgeon: Honor Loh Ward, MD;                Location: [facility] ORS;  Service: Gynecology;                Laterality: N/A; 07/03/2015: LAPAROSCOPIC OVARIAN CYSTECTOMY N/A     Comment: Procedure: LAPAROSCOPIC OVARIAN CYSTECTOMY;                Surgeon: Honor Loh Ward, MD;  Location: [facility]               ORS;  Service: Gynecology;  Laterality: N/A; No date: NASAL SINUS SURGERY No date: PLACEMENT OF BREAST IMPLANTS  BMI    Body Mass Index:  20.70 kg/m      Reproductive/Obstetrics negative OB ROS                             Anesthesia Physical Anesthesia Plan  ASA: III  Anesthesia Plan:  General ETT   Post-op Pain Management:    Induction:   Airway Management Planned:   Additional Equipment:   Intra-op Plan:   Post-operative Plan:   Informed Consent: I have reviewed the patients History and Physical, chart, labs and discussed the procedure including the risks, benefits and alternatives for the proposed anesthesia with the patient or authorized representative who has indicated his/her understanding and acceptance.   Dental Advisory Given  Plan Discussed with: Anesthesiologist, CRNA and Surgeon  Anesthesia Plan Comments:         Anesthesia Quick Evaluation

## 2016-12-31 NOTE — Anesthesia Postprocedure Evaluation (Signed)
Anesthesia Post Note  Patient: Loretta Floyd  Procedure(s) Performed: Procedure(s) (LRB): HYSTERECTOMY TOTAL LAPAROSCOPIC (N/A) LAPAROSCOPIC LYSIS OF ADHESIONS LAPAROSCOPIC UNILATERAL SALPINGECTOMY (Left)  Patient location during evaluation: PACU Anesthesia Type: General Level of consciousness: awake and alert Pain management: pain level controlled Vital Signs Assessment: post-procedure vital signs reviewed and stable Respiratory status: spontaneous breathing, nonlabored ventilation, respiratory function stable and patient connected to nasal cannula oxygen Cardiovascular status: blood pressure returned to baseline and stable Postop Assessment: no signs of nausea or vomiting Anesthetic complication details: anesthesia complicationsComments: Dr. Amie Critchley called to PACU for report of chipped tooth.  By report from both patient and nursing staff, the patient spit out a small piece of tooth in the PACU.  Patient's teeth examined, she does seem to have a small chip on the medial lower corner of her right top medial incisor that coincides with a small piece of tooth that she spit out. No other dental damage visualized by MD or endorsed by patient at this time. Patient reports that she had chipped this tooth in the past and that a piece was glued back into this chipped place, she feels that this is the piece that fell out.  I informed her that we are very sorry that this happened.  That her intubation was very normal, straightforward, and that no dental trauma was seen during her intubation or extubation.  That I was very surprised that her tooth is now chipped. That there is not anything that I would have done differently that I feel would have prevented this.  That this can happen sometimes and this is why we consent patients for the small risk of dental trauma during an anesthetic. That it was safer for her that this piece fell out in the PACU and not while she was under anesthesia, as it  decreased her chance for aspirating it. This was explained to the patient who voiced understanding.  The patient asked if we would pay to fix this tooth. I informed her that our policy is that we do not normally pay for dental damages that result from normal standard care and that this is why we consent patient's for these risks.   Patient voiced understanding and was very calm and pleasant during this conversation. The patient later stated that she grinds her teeth at night and feels that this may have happened when she was "waking up". This conversation was also had with the patients husband who voiced understanding.     Last Vitals:  Vitals:   12/31/16 1415 12/31/16 1427  BP: 121/70 120/62  Pulse: 69 75  Resp: 10 14  Temp: 37.4 C 37.4 C    Last Pain:  Vitals:   12/31/16 1427  TempSrc: Temporal  PainSc: 6                  Precious Haws [place] Franson

## 2016-12-31 NOTE — Anesthesia Procedure Notes (Signed)
Procedure Name: Intubation Date/Time: 12/31/2016 10:23 AM Performed by: Silvana Newness Pre-anesthesia Checklist: Patient identified, Emergency Drugs available, Suction available, Patient being monitored and Timeout performed Patient Re-evaluated:Patient Re-evaluated prior to inductionOxygen Delivery Method: Circle system utilized Preoxygenation: Pre-oxygenation with 100% oxygen Intubation Type: IV induction Ventilation: Mask ventilation without difficulty Laryngoscope Size: Mac and 3 Grade View: Grade I Tube type: Oral Tube size: 7.0 mm Number of attempts: 1 Airway Equipment and Method: Stylet Placement Confirmation: ETT inserted through vocal cords under direct vision,  positive ETCO2 and breath sounds checked- equal and bilateral Secured at: 19 cm Tube secured with: Tape Dental Injury: Teeth and Oropharynx as per pre-operative assessment

## 2017-01-03 ENCOUNTER — Encounter: Payer: Self-pay | Admitting: Obstetrics & Gynecology

## 2017-01-03 LAB — SURGICAL PATHOLOGY

## 2019-10-24 ENCOUNTER — Other Ambulatory Visit: Payer: Self-pay

## 2019-10-24 DIAGNOSIS — Z20822 Contact with and (suspected) exposure to covid-19: Secondary | ICD-10-CM

## 2019-10-25 LAB — NOVEL CORONAVIRUS, NAA: SARS-CoV-2, NAA: NOT DETECTED

## 2020-03-02 ENCOUNTER — Ambulatory Visit: Payer: BLUE CROSS/BLUE SHIELD | Attending: Internal Medicine

## 2020-03-02 DIAGNOSIS — Z23 Encounter for immunization: Secondary | ICD-10-CM

## 2020-03-02 NOTE — Progress Notes (Signed)
   Covid-19 Vaccination Clinic  Name:  SHATEMA KOST    MRN: [id number] DOB: 07/22/1961  03/02/2020  Ms. Ridpath was observed post Covid-19 immunization for 15 minutes without incident. She was provided with Vaccine Information Sheet and instruction to access the V-Safe system.   Ms. Farell was instructed to call 911 with any severe reactions post vaccine: Marland Kitchen Difficulty breathing  . Swelling of face and throat  . A fast heartbeat  . A bad rash all over body  . Dizziness and weakness   Immunizations Administered    Name Date Dose VIS Date Route   Pfizer COVID-19 Vaccine 03/02/2020 10:58 AM 0.3 mL 11/16/2019 Intramuscular   Manufacturer: [place]   Lot: [id number]   [place]: [id number]

## 2020-03-25 ENCOUNTER — Ambulatory Visit: Payer: Self-pay | Attending: Internal Medicine

## 2020-03-25 DIAGNOSIS — Z23 Encounter for immunization: Secondary | ICD-10-CM

## 2020-03-25 NOTE — Progress Notes (Signed)
   Covid-19 Vaccination Clinic  Name:  Loretta Floyd    MRN: [id number] DOB: 29-Oct-1961  03/25/2020  [name] was observed post Covid-19 immunization for 15 minutes without incident. She was provided with Vaccine Information Sheet and instruction to access the V-Safe system.   [name] was instructed to call 911 with any severe reactions post vaccine: Marland Kitchen Difficulty breathing  . Swelling of face and throat  . A fast heartbeat  . A bad rash all over body  . Dizziness and weakness   Immunizations Administered    Name Date Dose VIS Date Route   Pfizer COVID-19 Vaccine 03/25/2020  4:11 PM 0.3 mL 01/30/2019 Intramuscular   Manufacturer: [place]   Lot: [id number]   [place]: [id number]

## 2020-07-16 ENCOUNTER — Other Ambulatory Visit: Payer: Self-pay | Admitting: Obstetrics & Gynecology

## 2020-07-16 DIAGNOSIS — Z1231 Encounter for screening mammogram for malignant neoplasm of breast: Secondary | ICD-10-CM

## 2020-07-28 ENCOUNTER — Other Ambulatory Visit: Payer: Self-pay

## 2020-07-28 ENCOUNTER — Ambulatory Visit
Admission: RE | Admit: 2020-07-28 | Discharge: 2020-07-28 | Disposition: A | Discharge status: Home / Self Care | Payer: 59 | Source: Ambulatory Visit | Attending: Obstetrics & Gynecology | Admitting: Obstetrics & Gynecology

## 2020-07-28 DIAGNOSIS — Z1231 Encounter for screening mammogram for malignant neoplasm of breast: Secondary | ICD-10-CM

## 2022-01-03 IMAGING — MG DIGITAL SCREENING BREAST BILAT IMPLANT W/ TOMO W/ CAD
9 of 12 series · 9 of 28 positions shown · non-contrast
Comparison: Previous exams.

CLINICAL DATA: Screening.

EXAM:
DIGITAL SCREENING BILATERAL MAMMOGRAM WITH IMPLANTS, CAD AND TOMO
The patient has bilateral prepectoral silicone implants. Standard
and implant displaced views were performed.

[L CC]
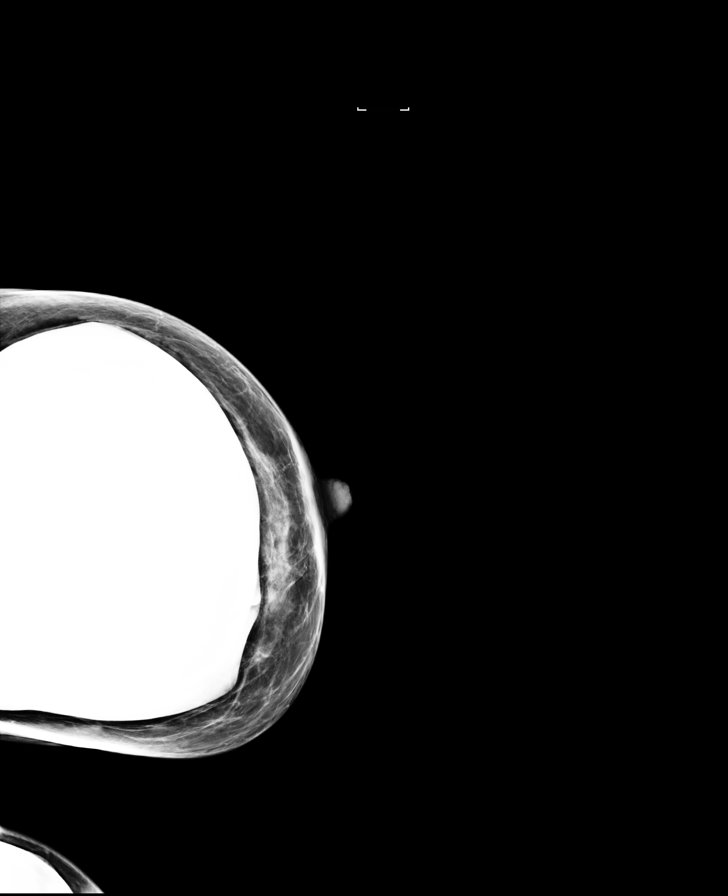

[R MLO]
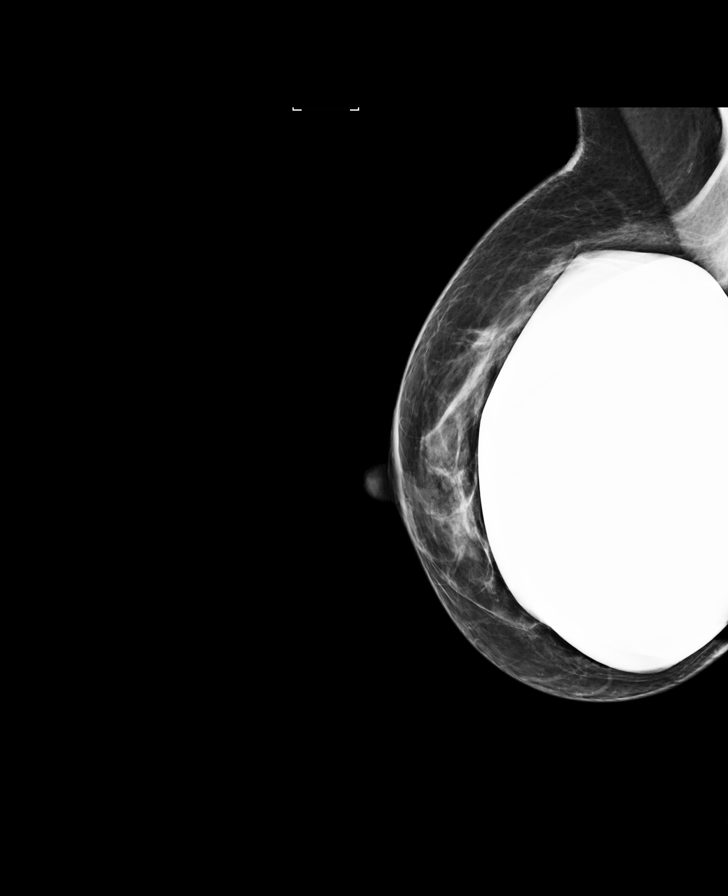

[L MLO]
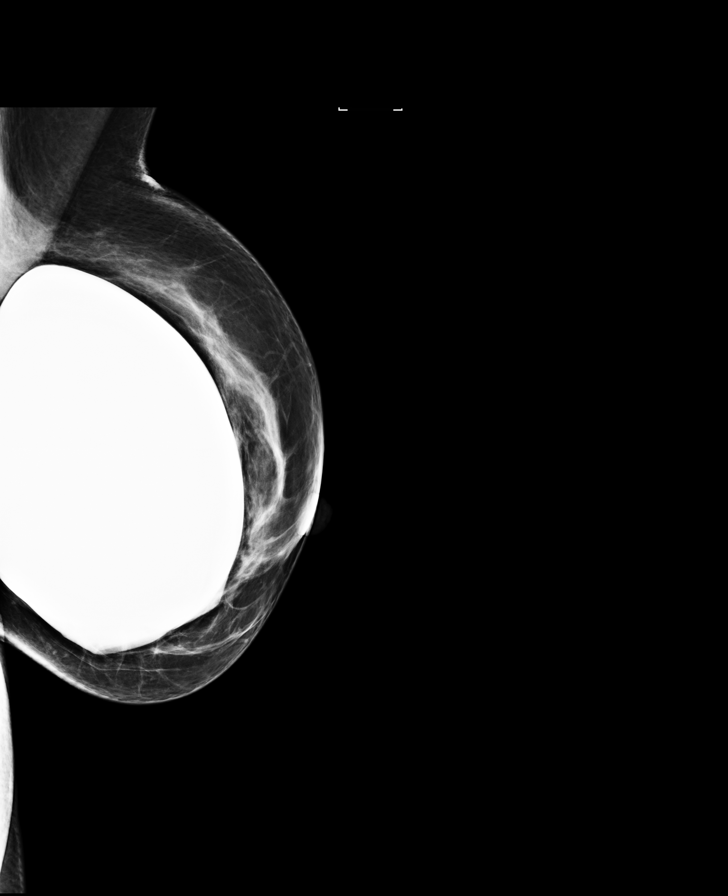

[R CC]
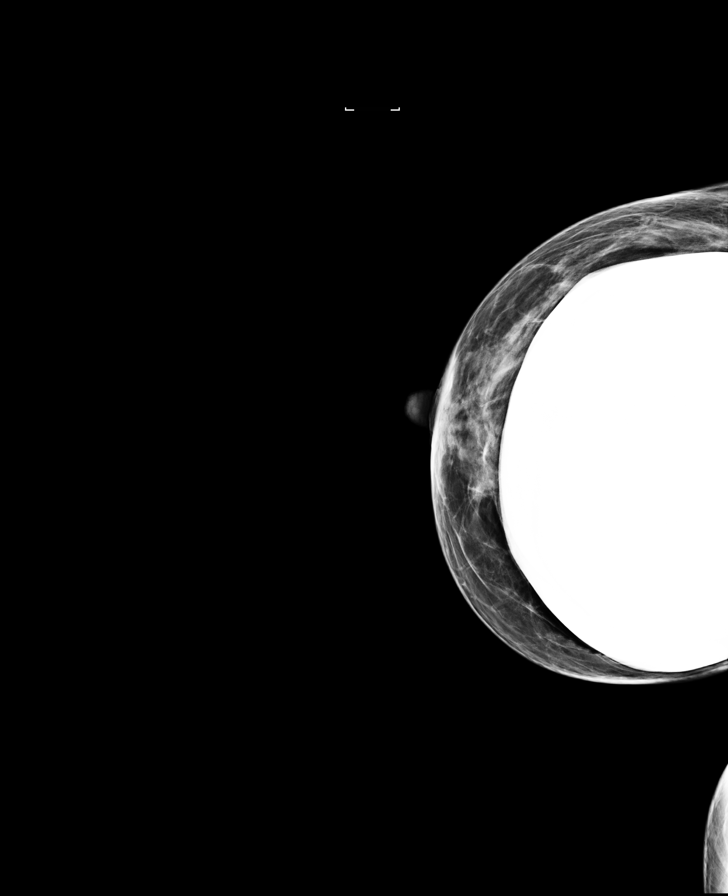

[L CC synth-2D]
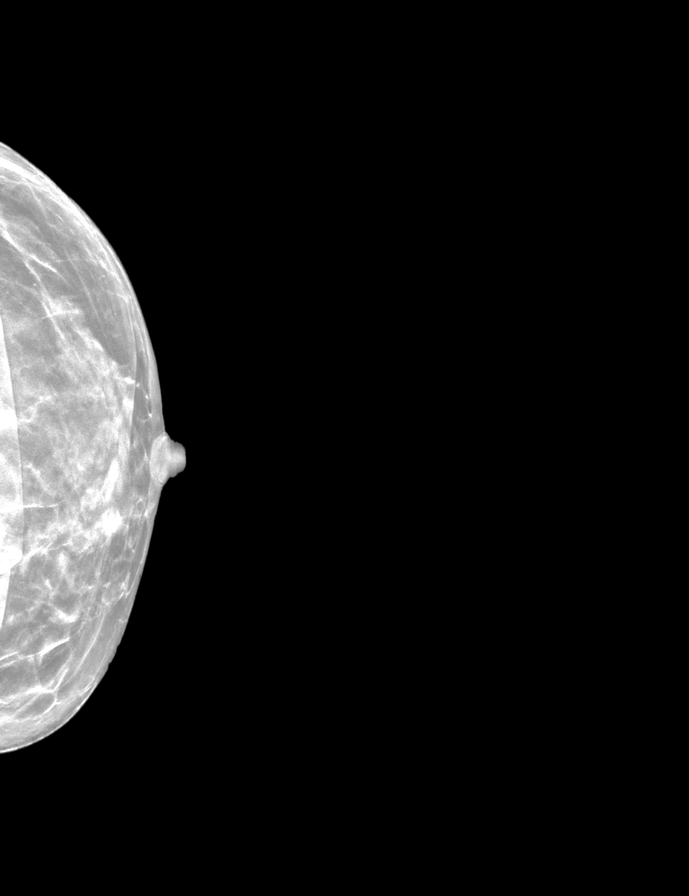

[R CC synth-2D]
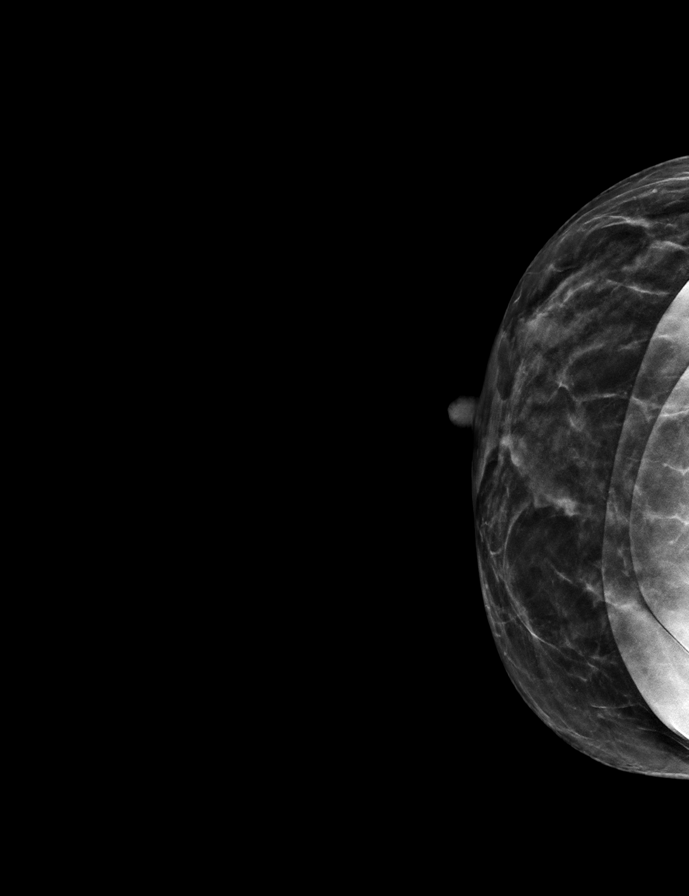

[L MLO synth-2D]
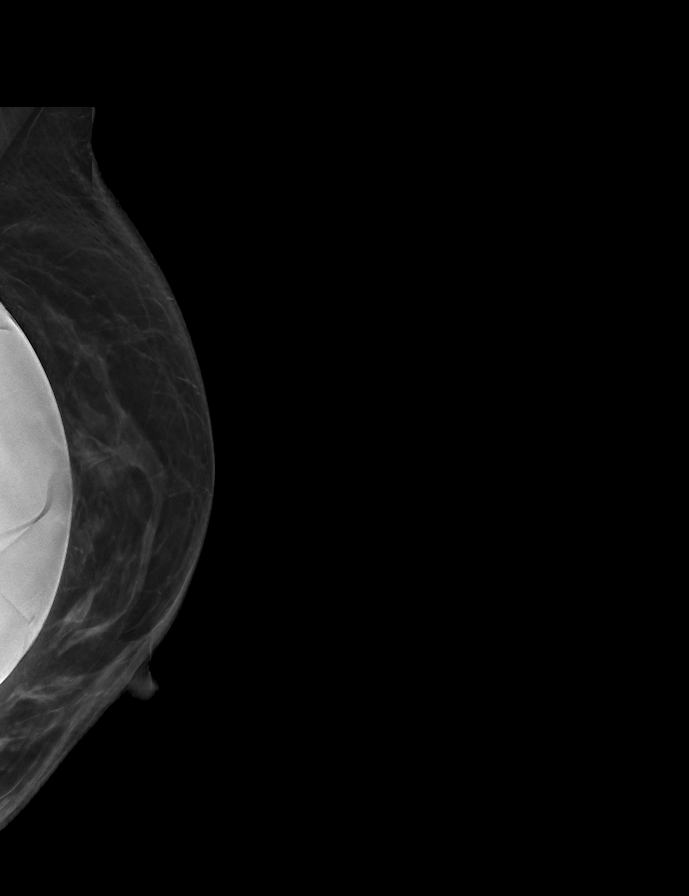

[R MLO synth-2D]
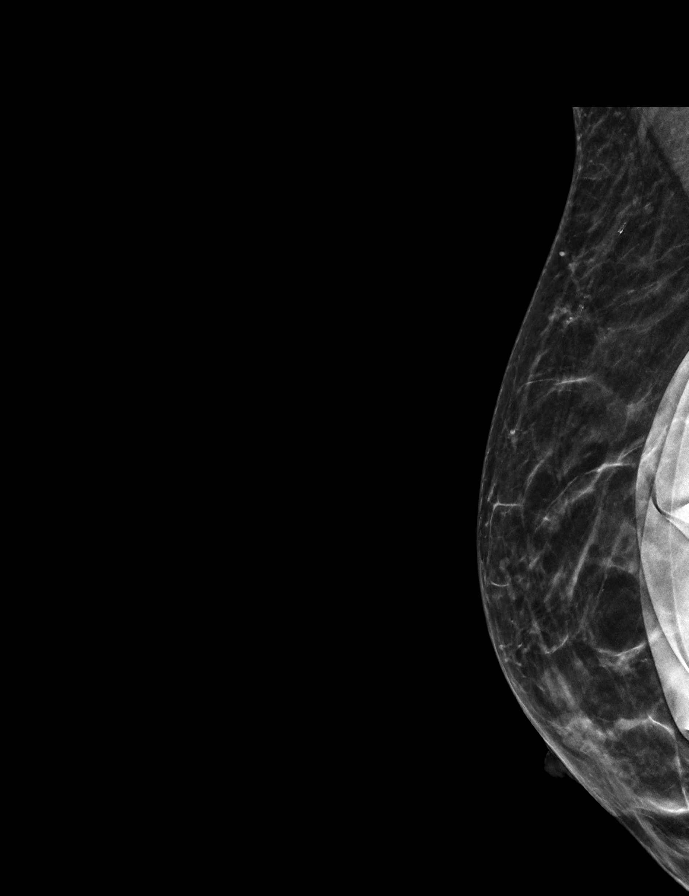

[R CCID BREAST TOMOSYNTHESIS IMAGE tomo · tomo slice 21/42.0]
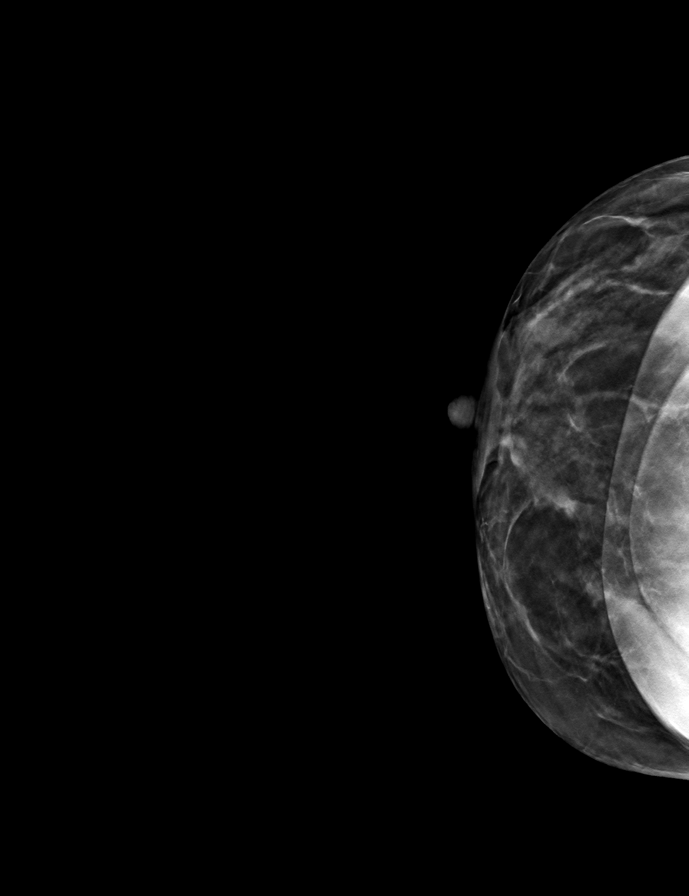

[9 of 28 positions shown; findings below may reference images not displayed]

ACR Breast Density Category c: The breast tissue is heterogeneously
dense, which may obscure small masses.
FINDINGS: There are no findings suspicious for malignancy. Images were
processed with CAD.
IMPRESSION: No mammographic evidence of malignancy. A result letter of this
screening mammogram will be mailed directly to the patient.

RECOMMENDATION:
Screening mammogram in one year. (Code:XJ-[id number])

BI-RADS CATEGORY  1:  Negative.

## 2022-05-07 ENCOUNTER — Telehealth: Payer: Self-pay | Admitting: Obstetrics & Gynecology

## 2022-05-07 NOTE — Telephone Encounter (Signed)
Copied from [place] [id number]. Topic: Appointment Scheduling - New Patient >> May 06, 2022  4:16 PM Roslynn Amble wrote: New patient has been scheduled for your office. No  Provider: Marnee Guarneri Patient called in wanting to establish care with Dr Ned Card. She was a previous patient of Data processing manager and was referred to you by friends who are current patients. The nearest appointment is not until January and she would like to be sooner if possible. She was seeing Dr Leonides Schanz for fatigue. I offered patient to speak to a nurse and she declined at this time.  Route to department's PEC pool.

## 2022-05-12 NOTE — Telephone Encounter (Signed)
LVM asking patient to call back to schedule an appt 

## 2022-06-06 DIAGNOSIS — G4700 Insomnia, unspecified: Secondary | ICD-10-CM | POA: Insufficient documentation

## 2022-06-06 DIAGNOSIS — Z90710 Acquired absence of both cervix and uterus: Secondary | ICD-10-CM | POA: Insufficient documentation

## 2022-06-06 DIAGNOSIS — F321 Major depressive disorder, single episode, moderate: Secondary | ICD-10-CM | POA: Insufficient documentation

## 2022-06-06 NOTE — Patient Instructions (Signed)

## 2022-06-09 ENCOUNTER — Ambulatory Visit: Payer: 59 | Admitting: Nurse Practitioner

## 2022-06-09 ENCOUNTER — Encounter: Payer: Self-pay | Admitting: Nurse Practitioner

## 2022-06-09 VITALS — BP 102/66 | HR 66 | Temp 97.6°F | Ht 61.0 in | Wt 113.8 lb

## 2022-06-09 DIAGNOSIS — Z136 Encounter for screening for cardiovascular disorders: Secondary | ICD-10-CM | POA: Diagnosis not present

## 2022-06-09 DIAGNOSIS — E559 Vitamin D deficiency, unspecified: Secondary | ICD-10-CM | POA: Diagnosis not present

## 2022-06-09 DIAGNOSIS — Z1159 Encounter for screening for other viral diseases: Secondary | ICD-10-CM

## 2022-06-09 DIAGNOSIS — Z1231 Encounter for screening mammogram for malignant neoplasm of breast: Secondary | ICD-10-CM | POA: Diagnosis not present

## 2022-06-09 DIAGNOSIS — N952 Postmenopausal atrophic vaginitis: Secondary | ICD-10-CM | POA: Diagnosis not present

## 2022-06-09 DIAGNOSIS — F321 Major depressive disorder, single episode, moderate: Secondary | ICD-10-CM | POA: Diagnosis not present

## 2022-06-09 DIAGNOSIS — Z114 Encounter for screening for human immunodeficiency virus [HIV]: Secondary | ICD-10-CM

## 2022-06-09 DIAGNOSIS — R69 Illness, unspecified: Secondary | ICD-10-CM | POA: Diagnosis not present

## 2022-06-09 DIAGNOSIS — Z7689 Persons encountering health services in other specified circumstances: Secondary | ICD-10-CM

## 2022-06-09 DIAGNOSIS — R5383 Other fatigue: Secondary | ICD-10-CM | POA: Insufficient documentation

## 2022-06-09 DIAGNOSIS — F5101 Primary insomnia: Secondary | ICD-10-CM

## 2022-06-09 DIAGNOSIS — Z1211 Encounter for screening for malignant neoplasm of colon: Secondary | ICD-10-CM | POA: Diagnosis not present

## 2022-06-09 DIAGNOSIS — Z8639 Personal history of other endocrine, nutritional and metabolic disease: Secondary | ICD-10-CM | POA: Insufficient documentation

## 2022-06-09 DIAGNOSIS — E538 Deficiency of other specified B group vitamins: Secondary | ICD-10-CM | POA: Diagnosis not present

## 2022-06-09 DIAGNOSIS — Z13220 Encounter for screening for lipoid disorders: Secondary | ICD-10-CM | POA: Diagnosis not present

## 2022-06-09 MED ORDER — ESTRADIOL 0.1 MG/GM VA CREA: TOPICAL_CREAM | VAGINAL | 12 refills | Status: DC

## 2022-06-09 NOTE — Assessment & Plan Note (Signed)
Followed by psychiatry, continue this collaboration and current medication regimen as prescribed by them.

## 2022-06-09 NOTE — Assessment & Plan Note (Signed)
Chronic, ongoing since loss of her daughter years ago.  Followed by psychiatry and therapy, continue this collaboration and current medication regimen as prescribed by them.

## 2022-06-09 NOTE — Assessment & Plan Note (Signed)
Using Estrace cream, but not finding much benefit.  Hormone levels today -- will consider short period of hormone replacement to assist with symptoms.  No uterus -- estrogen only.

## 2022-06-09 NOTE — Progress Notes (Signed)
New Patient Office Visit  Subjective    Patient ID: Loretta Floyd, female    DOB: 03-Sep-1961  Age: 61 y.o. MRN: [id number]  CC:  Chief Complaint  Patient presents with   Establish Care    Patient is here to establish care. Patient says she has been feeling weak since the passing of her daughter 4 years ago. Patient says she has not tried any over the counter supplements.     HPI Loretta DELPOZO presents for new patient visit to establish care.  Introduced to Designer, jewellery role and practice setting.  All questions answered.  Discussed provider/patient relationship and expectations.  Was being followed by Dr. Leonides Schanz, last visit in September 2021.   Has history of anemia and would like blood counts today, as has increased fatigue present.  Has not had labs since 2018.    She is prescribed estrace cream for vaginal atrophy, but reports this does not offer much benefit.  DEPRESSION Lost daughter 4 years ago,  continues with therapy, has been attending for some time. She does endorse depressed mood.  She continues on Adderall as needed (does not take scheduled basis), Sertraline daily, and Trazodone PRN.  Is followed by Dr. Robina Ade for psychiatry in [place].   Mood status: uncontrolled Symptom severity: moderate  Duration of current treatment : chronic Side effects: no Medication compliance: good compliance Psychotherapy/counseling: yes in the past Depressed mood: yes Anxious mood: yes Anhedonia: yes Significant weight loss or gain: no Insomnia: yes hard to fall asleep Fatigue: yes Feelings of worthlessness or guilt:  occcasional Impaired concentration/indecisiveness: no Suicidal ideations: no Hopelessness: yes Crying spells: yes    06/09/2022    2:40 PM  Depression screen PHQ 2/9  Decreased Interest 3  Down, Depressed, Hopeless 2  PHQ - 2 Score 5  Altered sleeping 0  Tired, decreased energy 3  Change in appetite 0  Feeling bad or failure about yourself  1  Trouble  concentrating 1  Moving slowly or fidgety/restless 0  Suicidal thoughts 0  PHQ-9 Score 10  Difficult doing work/chores Very difficult       06/09/2022    2:41 PM  GAD 7 : Generalized Anxiety Score  Nervous, Anxious, on Edge 2  Control/stop worrying 2  Worry too much - different things 2  Trouble relaxing 0  Restless 0  Easily annoyed or irritable 0  Afraid - awful might happen 1  Total GAD 7 Score 7  Anxiety Difficulty Somewhat difficult   Outpatient Encounter Medications as of 06/09/2022  Medication Sig   acetaminophen (TYLENOL) 500 MG tablet Take 2 tablets (1,000 mg total) by mouth every 6 (six) hours.   amphetamine-dextroamphetamine (ADDERALL) 20 MG tablet Take 20 mg by mouth 2 (two) times daily.   estradiol (ESTRACE) 0.1 MG/GM vaginal cream Place 1 applicator full vaginally daily at bedtime x 2 weeks and then reduce to one applicator vaginally at bedtime twice a week only.   ibuprofen (ADVIL,MOTRIN) 600 MG tablet Take 1 tablet (600 mg total) by mouth every 6 (six) hours.   sertraline (ZOLOFT) 100 MG tablet Take 150 mg by mouth at bedtime.   traZODone (DESYREL) 150 MG tablet Take 75-150 mg by mouth at bedtime.   [DISCONTINUED] oxyCODONE (ROXICODONE) 5 MG immediate release tablet Take 1 tablet (5 mg total) by mouth every 4 (four) hours as needed for severe pain. (Patient not taking: Reported on 06/09/2022)   [DISCONTINUED] phentermine (ADIPEX-P) 37.5 MG tablet Take 37.5 mg by mouth daily as  needed (weight).    [DISCONTINUED] zolpidem (AMBIEN) 10 MG tablet Take 5 mg by mouth at bedtime as needed for sleep.    No facility-administered encounter medications on file as of 06/09/2022.    Past Medical History:  Diagnosis Date   Anemia    Depression     Past Surgical History:  Procedure Laterality Date   CESAREAN SECTION     ECTOPIC PREGNANCY SURGERY     HYSTEROSCOPY WITH D & C N/A 07/03/2015   Procedure: DILATATION AND CURETTAGE /HYSTEROSCOPY;  Surgeon: Honor Loh Ward, MD;  Location:  [facility];  Service: Gynecology;  Laterality: N/A;   LAPAROSCOPIC HYSTERECTOMY N/A 12/31/2016   Procedure: HYSTERECTOMY TOTAL LAPAROSCOPIC;  Surgeon: Honor Loh Ward, MD;  Location: [facility];  Service: Gynecology;  Laterality: N/A;   LAPAROSCOPIC LYSIS OF ADHESIONS  12/31/2016   Procedure: LAPAROSCOPIC LYSIS OF ADHESIONS;  Surgeon: Honor Loh Ward, MD;  Location: [facility];  Service: Gynecology;;   LAPAROSCOPIC OVARIAN CYSTECTOMY N/A 07/03/2015   Procedure: LAPAROSCOPIC OVARIAN CYSTECTOMY;  Surgeon: Honor Loh Ward, MD;  Location: [facility];  Service: Gynecology;  Laterality: N/A;   LAPAROSCOPIC UNILATERAL SALPINGECTOMY Left 12/31/2016   Procedure: LAPAROSCOPIC UNILATERAL SALPINGECTOMY;  Surgeon: Honor Loh Ward, MD;  Location: [facility];  Service: Gynecology;  Laterality: Left;   NASAL SINUS SURGERY     PLACEMENT OF BREAST IMPLANTS      Family History  Problem Relation Age of Onset   Healthy Mother    Cancer Daughter     Social History   Socioeconomic History   Marital status: Married    Spouse name: Not on file   Number of children: Not on file   Years of education: Not on file   Highest education level: Not on file  Occupational History   Not on file  Tobacco Use   Smoking status: Former    Types: Cigarettes   Smokeless tobacco: Never  Substance and Sexual Activity   Alcohol use: Yes    Comment: rare   Drug use: No   Sexual activity: Not on file  Other Topics Concern   Not on file  Social History Narrative   Not on file   Social Determinants of Health   Financial Resource Strain: Low Risk  (06/09/2022)   Overall Financial Resource Strain (CARDIA)    Difficulty of Paying Living Expenses: Not hard at all  Food Insecurity: No Food Insecurity (06/09/2022)   Hunger Vital Sign    Worried About Running Out of Food in the Last Year: Never true    [place] in the Last Year: Never true  Transportation Needs: No Transportation Needs (06/09/2022)   PRAPARE - Civil engineer, contracting (Medical): No    Lack of Transportation (Non-Medical): No  Physical Activity: Insufficiently Active (06/09/2022)   Exercise Vital Sign    Days of Exercise per Week: 3 days    Minutes of Exercise per Session: 30 min  Stress: No Stress Concern Present (06/09/2022)   [place]    Feeling of Stress : Only a little  Social Connections: Moderately Integrated (06/09/2022)   Social Connection and Isolation Panel [NHANES]    Frequency of Communication with Friends and Family: More than three times a week    Frequency of Social Gatherings with Friends and Family: More than three times a week    Attends Religious Services: More than 4 times per year    Active Member  of Clubs or Organizations: No    Attends Archivist Meetings: Never    Marital Status: Married  Human resources officer Violence: Not At Risk (06/09/2022)   Humiliation, Afraid, Rape, and Kick questionnaire    Fear of Current or Ex-Partner: No    Emotionally Abused: No    Physically Abused: No    Sexually Abused: No    Review of Systems  Constitutional:  Positive for malaise/fatigue. Negative for chills, diaphoresis, fever and weight loss.  Respiratory:  Negative for cough, shortness of breath and wheezing.   Cardiovascular:  Negative for chest pain, palpitations, orthopnea and leg swelling.  Gastrointestinal: Negative.   Neurological: Negative.   Endo/Heme/Allergies: Negative.   Psychiatric/Behavioral:  Positive for depression. Negative for suicidal ideas. The patient is nervous/anxious and has insomnia.       Objective    BP 102/66   Pulse 66   Temp 97.6 F (36.4 C) (Oral)   Ht '5\' 1"'$  (1.549 m)   Wt 113 lb 12.8 oz (51.6 kg)   LMP  (LMP Unknown)   SpO2 96%   BMI 21.50 kg/m   Physical Exam Vitals and nursing note reviewed.  Constitutional:      General: She is awake. She is not in acute distress.    Appearance: She is well-developed.  She is not ill-appearing or toxic-appearing.  HENT:     Head: Normocephalic.     Right Ear: Hearing normal.     Left Ear: Hearing normal.     Nose: Nose normal.     Mouth/Throat:     Mouth: Mucous membranes are moist.  Eyes:     General: Lids are normal.        Right eye: No discharge.        Left eye: No discharge.     Conjunctiva/sclera: Conjunctivae normal.     Pupils: Pupils are equal, round, and reactive to light.  Neck:     Thyroid: No thyromegaly.     Vascular: No carotid bruit or JVD.  Cardiovascular:     Rate and Rhythm: Normal rate and regular rhythm.     Heart sounds: Normal heart sounds. No murmur heard.    No gallop.  Pulmonary:     Effort: Pulmonary effort is normal.     Breath sounds: Normal breath sounds.  Abdominal:     General: Bowel sounds are normal.     Palpations: Abdomen is soft.  Musculoskeletal:     Cervical back: Normal range of motion and neck supple.     Right lower leg: No edema.     Left lower leg: No edema.  Lymphadenopathy:     Cervical: No cervical adenopathy.  Skin:    General: Skin is warm and dry.  Neurological:     Mental Status: She is alert and oriented to person, place, and time.  Psychiatric:        Attention and Perception: Attention normal.        Mood and Affect: Mood normal.        Behavior: Behavior normal. Behavior is cooperative.        Thought Content: Thought content normal.        Judgment: Judgment normal.    Last CBC Lab Results  Component Value Date   WBC 4.2 12/30/2016   HGB 11.5 (L) 12/30/2016   HCT 33.6 (L) 12/30/2016   MCV 87.3 12/30/2016   MCH 29.8 12/30/2016   RDW 14.5 12/30/2016   PLT 284 12/30/2016  Last metabolic panel Lab Results  Component Value Date   GLUCOSE 83 12/30/2016   NA 138 12/30/2016   K 3.8 12/30/2016   CL 107 12/30/2016   CO2 25 12/30/2016   BUN 15 12/30/2016   CREATININE 0.74 12/30/2016   GFRNONAA >60 12/30/2016   CALCIUM 9.1 12/30/2016   ANIONGAP 6 12/30/2016   Last  lipids No results found for: "CHOL", "HDL", "LDLCALC", "LDLDIRECT", "TRIG", "CHOLHDL" Last hemoglobin A1c No results found for: "HGBA1C" Last thyroid functions No results found for: "TSH", "T3TOTAL", "T4TOTAL", "THYROIDAB" Last vitamin D No results found for: "25OHVITD2", "25OHVITD3", "VD25OH" Last vitamin B12 and Folate No results found for: "VITAMINB12", "FOLATE"      Assessment & Plan:   Problem List Items Addressed This Visit       Genitourinary   Vaginal atrophy    Using Estrace cream, but not finding much benefit.  Hormone levels today -- will consider short period of hormone replacement to assist with symptoms.  No uterus -- estrogen only.        Other   Depression, major, single episode, moderate (HCC) - Primary    Chronic, ongoing since loss of her daughter years ago.  Followed by psychiatry and therapy, continue this collaboration and current medication regimen as prescribed by them.      Relevant Medications   traZODone (DESYREL) 150 MG tablet   Fatigue   Relevant Orders   Vitamin B12   VITAMIN D 25 Hydroxy (Vit-D Deficiency, Fractures)   TSH   History of iron deficiency    History of, with increased fatigue reported today.  Check levels and initiate supplement as needed.      Relevant Orders   CBC with Differential/Platelet   Comprehensive metabolic panel   Iron Binding Cap (TIBC)([facility])   Ferritin   FSH/LH   Insomnia    Followed by psychiatry, continue this collaboration and current medication regimen as prescribed by them.      Other Visit Diagnoses     B12 deficiency       History of low levels reported, check labs today and initiate supplement as needed.   Relevant Orders   CBC with Differential/Platelet   Vitamin B12   Vitamin D deficiency       History of low levels reported, check labs today and initiate supplement as needed.   Relevant Orders   VITAMIN D 25 Hydroxy (Vit-D Deficiency, Fractures)   Encounter for lipid  screening for cardiovascular disease       Lipid screening on labs today.   Relevant Orders   Comprehensive metabolic panel   Lipid Panel w/o Chol/HDL Ratio   Need for hepatitis C screening test       Hep C screening on labs today, discussed with patient.   Relevant Orders   Hepatitis C antibody   Encounter for screening for HIV       HIV screening on labs today, discussed with patient.   Relevant Orders   HIV Antibody (routine testing w rflx)   Colon cancer screening       Cologuard ordered today.   Relevant Orders   Cologuard   Encounter for screening mammogram for malignant neoplasm of breast       Mammogram ordered and patient to schedule.   Relevant Orders   MM 3D SCREEN BREAST BILATERAL   Encounter to establish care           Return in about 4 weeks (around 07/07/2022) for FATIGUE.   Barbaraann Faster  Ned Card, NP

## 2022-06-09 NOTE — Assessment & Plan Note (Signed)
History of, with increased fatigue reported today.  Check levels and initiate supplement as needed.

## 2022-06-10 ENCOUNTER — Encounter: Payer: Self-pay | Admitting: Nurse Practitioner

## 2022-06-10 DIAGNOSIS — D519 Vitamin B12 deficiency anemia, unspecified: Secondary | ICD-10-CM | POA: Insufficient documentation

## 2022-06-10 DIAGNOSIS — E7800 Pure hypercholesterolemia, unspecified: Secondary | ICD-10-CM | POA: Insufficient documentation

## 2022-06-10 LAB — CBC WITH DIFFERENTIAL/PLATELET
Basophils Absolute: 0 10*3/uL (ref 0.0–0.2)
Basos: 1 %
EOS (ABSOLUTE): 0.2 10*3/uL (ref 0.0–0.4)
Eos: 4 %
Hematocrit: 42.8 % (ref 34.0–46.6)
Hemoglobin: 14.4 g/dL (ref 11.1–15.9)
Immature Grans (Abs): 0 10*3/uL (ref 0.0–0.1)
Immature Granulocytes: 0 %
Lymphocytes Absolute: 1 10*3/uL (ref 0.7–3.1)
Lymphs: 23 %
MCH: 29.3 pg (ref 26.6–33.0)
MCHC: 33.6 g/dL (ref 31.5–35.7)
MCV: 87 fL (ref 79–97)
Monocytes Absolute: 0.3 10*3/uL (ref 0.1–0.9)
Monocytes: 7 %
Neutrophils Absolute: 3 10*3/uL (ref 1.4–7.0)
Neutrophils: 65 %
Platelets: 245 10*3/uL (ref 150–450)
RBC: 4.91 x10E6/uL (ref 3.77–5.28)
RDW: 12.9 % (ref 11.7–15.4)
WBC: 4.5 10*3/uL (ref 3.4–10.8)

## 2022-06-10 LAB — VITAMIN D 25 HYDROXY (VIT D DEFICIENCY, FRACTURES): Vit D, 25-Hydroxy: 45.4 ng/mL (ref 30.0–100.0)

## 2022-06-10 LAB — VITAMIN B12: Vitamin B-12: 268 pg/mL (ref 232–1245)

## 2022-06-10 LAB — TSH: TSH: 1.92 u[IU]/mL (ref 0.450–4.500)

## 2022-06-10 LAB — LIPID PANEL W/O CHOL/HDL RATIO
Cholesterol, Total: 301 mg/dL — ABNORMAL HIGH (ref 100–199)
HDL: 85 mg/dL (ref >39)
LDL Chol Calc (NIH): 198 mg/dL — ABNORMAL HIGH (ref 0–99)
Triglycerides: 106 mg/dL (ref 0–149)
VLDL Cholesterol Cal: 18 mg/dL (ref 5–40)

## 2022-06-10 LAB — IRON AND TIBC
Iron Saturation: 27 % (ref 15–55)
Iron: 106 ug/dL (ref 27–139)
Total Iron Binding Capacity: 390 ug/dL (ref 250–450)
UIBC: 284 ug/dL (ref 118–369)

## 2022-06-10 LAB — COMPREHENSIVE METABOLIC PANEL
ALT: 13 IU/L (ref 0–32)
AST: 20 IU/L (ref 0–40)
Albumin/Globulin Ratio: 2.2 (ref 1.2–2.2)
Albumin: 5.4 g/dL — ABNORMAL HIGH (ref 3.8–4.8)
Alkaline Phosphatase: 80 IU/L (ref 44–121)
BUN/Creatinine Ratio: 15 (ref 12–28)
BUN: 12 mg/dL (ref 8–27)
Bilirubin Total: 0.3 mg/dL (ref 0.0–1.2)
CO2: 24 mmol/L (ref 20–29)
Calcium: 10.1 mg/dL (ref 8.7–10.3)
Chloride: 101 mmol/L (ref 96–106)
Creatinine, Ser: 0.79 mg/dL (ref 0.57–1.00)
Globulin, Total: 2.5 g/dL (ref 1.5–4.5)
Glucose: 98 mg/dL (ref 70–99)
Potassium: 4.6 mmol/L (ref 3.5–5.2)
Sodium: 140 mmol/L (ref 134–144)
Total Protein: 7.9 g/dL (ref 6.0–8.5)
eGFR: 85 mL/min/{1.73_m2} (ref >59)

## 2022-06-10 LAB — FSH/LH
FSH: 78.6 m[IU]/mL
LH: 45.4 m[IU]/mL

## 2022-06-10 LAB — HEPATITIS C ANTIBODY: Hep C Virus Ab: NONREACTIVE

## 2022-06-10 LAB — FERRITIN: Ferritin: 112 ng/mL (ref 15–150)

## 2022-06-10 LAB — HIV ANTIBODY (ROUTINE TESTING W REFLEX): HIV Screen 4th Generation wRfx: NONREACTIVE

## 2022-06-10 NOTE — Progress Notes (Signed)
Contacted via [place]  -- need fasting lab only visit in 8 weeks please   Good afternoon Loretta Floyd, your labs have returned: - Kidney function, creatinine and eGFR, remains normal, as is liver function, AST and ALT.   - Cholesterol labs are quite elevated, we like to see LDL (bad cholesterol) under 190.  I would like you to return for fasting, early morning labs only in 8 weeks outpatient.  My staff will call to schedule.  I would like a better look at these with you fasting. - B12 is on lower side of normal, this can cause fatigue.  I would recommend you start taking Vitamin B12 1000 MCG daily by mouth, you can obtain this in vitamin section.   - Hormone levels show normal, post menopausal findings - we can further discuss oral hormones at next visit. - Remainder of labs are all stable.  Any questions? Keep being amazing!!  Thank you for allowing me to participate in your care.  I appreciate you. Kindest regards, Acire Tang

## 2022-07-04 NOTE — Patient Instructions (Incomplete)
Fatigue If you have fatigue, you feel tired all the time and have a lack of energy or a lack of motivation. Fatigue may make it difficult to start or complete tasks because of exhaustion. Occasional or mild fatigue is often a normal response to activity or life. However, long-term (chronic) or extreme fatigue may be a symptom of a medical condition such as: Depression. Not having enough red blood cells or hemoglobin in the blood (anemia). A problem with a small gland located in the lower front part of the neck (thyroid disorder). Rheumatologic conditions. These are problems related to the body's defense system (immune system). Infections, especially certain viral infections. Fatigue can also lead to negative health outcomes over time. Follow these instructions at home: Medicines Take over-the-counter and prescription medicines only as told by your health care provider. Take a multivitamin if told by your health care provider. Do not use herbal or dietary supplements unless they are approved by your health care provider. Eating and drinking  Avoid heavy meals in the evening. Eat a well-balanced diet, which includes lean proteins, whole grains, plenty of fruits and vegetables, and low-fat dairy products. Avoid eating or drinking too many products with caffeine in them. Avoid alcohol. Drink enough fluid to keep your urine pale yellow. Activity  Exercise regularly, as told by your health care provider. Use or practice techniques to help you relax, such as yoga, tai chi, meditation, or massage therapy. Lifestyle Change situations that cause you stress. Try to keep your work and personal schedules in balance. Do not use recreational or illegal drugs. General instructions Monitor your fatigue for any changes. Go to bed and get up at the same time every day. Avoid fatigue by pacing yourself during the day and getting enough sleep at night. Maintain a healthy weight. Contact a health care  provider if: Your fatigue does not get better. You have a fever. You suddenly lose or gain weight. You have headaches. You have trouble falling asleep or sleeping through the night. You feel angry, guilty, anxious, or sad. You have swelling in your legs or another part of your body. Get help right away if: You feel confused, feel like you might faint, or faint. Your vision is blurry or you have a severe headache. You have severe pain in your abdomen, your back, or the area between your waist and hips (pelvis). You have chest pain, shortness of breath, or an irregular or fast heartbeat. You are unable to urinate, or you urinate less than normal. You have abnormal bleeding from the rectum, nose, lungs, nipples, or, if you are female, the vagina. You vomit blood. You have thoughts about hurting yourself or others. These symptoms may be an emergency. Get help right away. Call 911. Do not wait to see if the symptoms will go away. Do not drive yourself to the hospital. Get help right away if you feel like you may hurt yourself or others, or have thoughts about taking your own life. Go to your nearest emergency room or: Call 911. Call the National Suicide Prevention Lifeline at 1-800-273-8255 or 988. This is open 24 hours a day. Text the Crisis Text Line at 741741. Summary If you have fatigue, you feel tired all the time and have a lack of energy or a lack of motivation. Fatigue may make it difficult to start or complete tasks because of exhaustion. Long-term (chronic) or extreme fatigue may be a symptom of a medical condition. Exercise regularly, as told by your health care provider.   Change situations that cause you stress. Try to keep your work and personal schedules in balance. This information is not intended to replace advice given to you by your health care provider. Make sure you discuss any questions you have with your health care provider. Document Revised: 09/14/2021 Document  Reviewed: 09/14/2021 Elsevier Patient Education  2023 [employer]  

## 2022-07-06 ENCOUNTER — Encounter: Payer: Self-pay | Admitting: Nurse Practitioner

## 2022-07-06 ENCOUNTER — Ambulatory Visit: Payer: 59 | Admitting: Nurse Practitioner

## 2022-07-06 VITALS — BP 113/54 | HR 67 | Temp 97.3°F | Wt 110.2 lb

## 2022-07-06 DIAGNOSIS — R5383 Other fatigue: Secondary | ICD-10-CM | POA: Diagnosis not present

## 2022-07-06 DIAGNOSIS — D518 Other vitamin B12 deficiency anemias: Secondary | ICD-10-CM

## 2022-07-06 DIAGNOSIS — E7800 Pure hypercholesterolemia, unspecified: Secondary | ICD-10-CM | POA: Diagnosis not present

## 2022-07-06 DIAGNOSIS — R69 Illness, unspecified: Secondary | ICD-10-CM | POA: Diagnosis not present

## 2022-07-06 DIAGNOSIS — F321 Major depressive disorder, single episode, moderate: Secondary | ICD-10-CM

## 2022-07-06 NOTE — Assessment & Plan Note (Signed)
Chronic, ongoing since loss of her daughter years ago.  Followed by psychiatry and therapy, continue this collaboration and current medication regimen as prescribed by them.  Suspect much of fatigue comes from loss of daughter and ongoing depression.

## 2022-07-06 NOTE — Progress Notes (Signed)
BP (!) 113/54   Pulse 67   Temp (!) 97.3 F (36.3 C) (Oral)   Wt 110 lb 3.2 oz (50 kg)   LMP  (LMP Unknown)   SpO2 96%   BMI 20.82 kg/m    Subjective:    Patient ID: Loretta Floyd, female    DOB: 08-Jan-1961, 61 y.o.   MRN: [id number]  HPI: Loretta Floyd is a 61 y.o. female  Chief Complaint  Patient presents with   Fatigue    Patient is here for four week follow up on Fatigue. Patient says she is doing a little better, but about the same.    FATIGUE Was noted to have lower level B12 (268) on labs 06/09/22, she has started supplement.  LDL was elevated, was not fasting.  Hormone levels noted menopausal levels -- she had hysterectomy in 2018.  She feels much of her fatigue started after loss of her daughter 4 years, had cancer when younger, survived this.  Has two remaining sons.  Her daughter's dog passed last week, unexpectedly.  Duration:  chronic Severity: 8/10  Onset: gradual Context when symptoms started:  loss of daughter 4 years ago Symptoms improve with rest: no  Depressive symptoms: yes Stress/anxiety: yes Insomnia: yes hard to fall asleep -- sleeps well with Trazodone Snoring: no Observed apnea by bed partner: no Daytime hypersomnolence:no Wakes feeling refreshed:  not at all History of sleep study: no Dysnea on exertion:  no Orthopnea/PND: no Chest pain: no Chronic cough: no Lower extremity edema: no Arthralgias:no Myalgias: no Weakness: no Rash: no   Relevant past medical, surgical, family and social history reviewed and updated as indicated. Interim medical history since our last visit reviewed. Allergies and medications reviewed and updated.  Review of Systems  Constitutional:  Negative for activity change, appetite change, diaphoresis, fatigue and fever.  Respiratory:  Negative for cough, chest tightness and shortness of breath.   Cardiovascular:  Negative for chest pain, palpitations and leg swelling.  Gastrointestinal: Negative.    Neurological: Negative.   Psychiatric/Behavioral: Negative.      Per HPI unless specifically indicated above     Objective:    BP (!) 113/54   Pulse 67   Temp (!) 97.3 F (36.3 C) (Oral)   Wt 110 lb 3.2 oz (50 kg)   LMP  (LMP Unknown)   SpO2 96%   BMI 20.82 kg/m   Wt Readings from Last 3 Encounters:  07/06/22 110 lb 3.2 oz (50 kg)  06/09/22 113 lb 12.8 oz (51.6 kg)  12/31/16 115 lb (52.2 kg)    Physical Exam Vitals and nursing note reviewed.  Constitutional:      General: She is awake. She is not in acute distress.    Appearance: She is well-developed. She is not ill-appearing or toxic-appearing.  HENT:     Head: Normocephalic.     Right Ear: Hearing normal.     Left Ear: Hearing normal.     Nose: Nose normal.     Mouth/Throat:     Mouth: Mucous membranes are moist.  Eyes:     General: Lids are normal.        Right eye: No discharge.        Left eye: No discharge.     Conjunctiva/sclera: Conjunctivae normal.     Pupils: Pupils are equal, round, and reactive to light.  Neck:     Thyroid: No thyromegaly.     Vascular: No carotid bruit or JVD.  Cardiovascular:  Rate and Rhythm: Normal rate and regular rhythm.     Heart sounds: Normal heart sounds. No murmur heard.    No gallop.  Pulmonary:     Effort: Pulmonary effort is normal.     Breath sounds: Normal breath sounds.  Abdominal:     General: Bowel sounds are normal.     Palpations: Abdomen is soft.  Musculoskeletal:     Cervical back: Normal range of motion and neck supple.     Right lower leg: No edema.     Left lower leg: No edema.  Lymphadenopathy:     Cervical: No cervical adenopathy.  Skin:    General: Skin is warm and dry.  Neurological:     Mental Status: She is alert and oriented to person, place, and time.  Psychiatric:        Attention and Perception: Attention normal.        Mood and Affect: Mood normal.        Behavior: Behavior normal. Behavior is cooperative.        Thought  Content: Thought content normal.        Judgment: Judgment normal.     Results for orders placed or performed in visit on 06/09/22  CBC with Differential/Platelet  Result Value Ref Range   WBC 4.5 3.4 - 10.8 x10E3/uL   RBC 4.91 3.77 - 5.28 x10E6/uL   Hemoglobin 14.4 11.1 - 15.9 g/dL   Hematocrit 42.8 34.0 - 46.6 %   MCV 87 79 - 97 fL   MCH 29.3 26.6 - 33.0 pg   MCHC 33.6 31.5 - 35.7 g/dL   RDW 12.9 11.7 - 15.4 %   Platelets 245 150 - 450 x10E3/uL   Neutrophils 65 Not Estab. %   Lymphs 23 Not Estab. %   Monocytes 7 Not Estab. %   Eos 4 Not Estab. %   Basos 1 Not Estab. %   Neutrophils Absolute 3.0 1.4 - 7.0 x10E3/uL   Lymphocytes Absolute 1.0 0.7 - 3.1 x10E3/uL   Monocytes Absolute 0.3 0.1 - 0.9 x10E3/uL   EOS (ABSOLUTE) 0.2 0.0 - 0.4 x10E3/uL   Basophils Absolute 0.0 0.0 - 0.2 x10E3/uL   Immature Granulocytes 0 Not Estab. %   Immature Grans (Abs) 0.0 0.0 - 0.1 x10E3/uL  Comprehensive metabolic panel  Result Value Ref Range   Glucose 98 70 - 99 mg/dL   BUN 12 8 - 27 mg/dL   Creatinine, Ser 0.79 0.57 - 1.00 mg/dL   eGFR 85 >59 mL/min/1.73   BUN/Creatinine Ratio 15 12 - 28   Sodium 140 134 - 144 mmol/L   Potassium 4.6 3.5 - 5.2 mmol/L   Chloride 101 96 - 106 mmol/L   CO2 24 20 - 29 mmol/L   Calcium 10.1 8.7 - 10.3 mg/dL   Total Protein 7.9 6.0 - 8.5 g/dL   Albumin 5.4 (H) 3.8 - 4.8 g/dL   Globulin, Total 2.5 1.5 - 4.5 g/dL   Albumin/Globulin Ratio 2.2 1.2 - 2.2   Bilirubin Total 0.3 0.0 - 1.2 mg/dL   Alkaline Phosphatase 80 44 - 121 IU/L   AST 20 0 - 40 IU/L   ALT 13 0 - 32 IU/L  Lipid Panel w/o Chol/HDL Ratio  Result Value Ref Range   Cholesterol, Total 301 (H) 100 - 199 mg/dL   Triglycerides 106 0 - 149 mg/dL   HDL 85 >39 mg/dL   VLDL Cholesterol Cal 18 5 - 40 mg/dL   LDL Chol Calc (NIH) 198 (  H) 0 - 99 mg/dL   Lipid Comment: Comment   Vitamin B12  Result Value Ref Range   Vitamin B-12 268 232 - 1,245 pg/mL  VITAMIN D 25 Hydroxy (Vit-D Deficiency, Fractures)   Result Value Ref Range   Vit D, 25-Hydroxy 45.4 30.0 - 100.0 ng/mL  TSH  Result Value Ref Range   TSH 1.920 0.450 - 4.500 uIU/mL  Hepatitis C antibody  Result Value Ref Range   Hep C Virus Ab Non Reactive Non Reactive  HIV Antibody (routine testing w rflx)  Result Value Ref Range   HIV Screen 4th Generation wRfx Non Reactive Non Reactive  Iron Binding Cap (TIBC)([facility])  Result Value Ref Range   Total Iron Binding Capacity 390 250 - 450 ug/dL   UIBC 284 118 - 369 ug/dL   Iron 106 27 - 139 ug/dL   Iron Saturation 27 15 - 55 %  Ferritin  Result Value Ref Range   Ferritin 112 15 - 150 ng/mL  FSH/LH  Result Value Ref Range   LH 45.4 mIU/mL   FSH 78.6 mIU/mL      Assessment & Plan:   Problem List Items Addressed This Visit       Other   B12 deficiency anemia    Noted on recent labs.  Supplement started, recheck outpatient labs.      Relevant Orders   Vitamin B12   Depression, major, single episode, moderate (HCC) - Primary    Chronic, ongoing since loss of her daughter years ago.  Followed by psychiatry and therapy, continue this collaboration and current medication regimen as prescribed by them.  Suspect much of fatigue comes from loss of daughter and ongoing depression.      Elevated low density lipoprotein (LDL) cholesterol level    Noted on recent labs, was not fasting.  Recheck outpatient labs with her fasting.  Would prefer not to start medication.  If elevations, could consider coronary calcium scoring, discussed with her today.      Relevant Orders   Lipid Panel w/o Chol/HDL Ratio   Fatigue    Ongoing issue, suspect much related to mood after loss of daughter.  Continue collaboration with psychiatry.  Recent labs overall reassuring.        Follow up plan: Return in about 6 months (around 01/06/2023) for DEPRESSION -- labs on  07/12/22 outpatient.

## 2022-07-06 NOTE — Assessment & Plan Note (Signed)
Ongoing issue, suspect much related to mood after loss of daughter.  Continue collaboration with psychiatry.  Recent labs overall reassuring.

## 2022-07-06 NOTE — Assessment & Plan Note (Signed)
Noted on recent labs, was not fasting.  Recheck outpatient labs with her fasting.  Would prefer not to start medication.  If elevations, could consider coronary calcium scoring, discussed with her today.

## 2022-07-06 NOTE — Assessment & Plan Note (Signed)
Noted on recent labs.  Supplement started, recheck outpatient labs.

## 2022-07-12 ENCOUNTER — Other Ambulatory Visit: Payer: 59

## 2022-07-12 DIAGNOSIS — D518 Other vitamin B12 deficiency anemias: Secondary | ICD-10-CM | POA: Diagnosis not present

## 2022-07-12 DIAGNOSIS — E7800 Pure hypercholesterolemia, unspecified: Secondary | ICD-10-CM | POA: Diagnosis not present

## 2022-07-13 ENCOUNTER — Encounter: Payer: Self-pay | Admitting: Nurse Practitioner

## 2022-07-13 DIAGNOSIS — E7800 Pure hypercholesterolemia, unspecified: Secondary | ICD-10-CM

## 2022-07-13 LAB — LIPID PANEL W/O CHOL/HDL RATIO
Cholesterol, Total: 270 mg/dL — ABNORMAL HIGH (ref 100–199)
HDL: 62 mg/dL (ref >39)
LDL Chol Calc (NIH): 180 mg/dL — ABNORMAL HIGH (ref 0–99)
Triglycerides: 155 mg/dL — ABNORMAL HIGH (ref 0–149)
VLDL Cholesterol Cal: 28 mg/dL (ref 5–40)

## 2022-07-13 LAB — VITAMIN B12: Vitamin B-12: 761 pg/mL (ref 232–1245)

## 2022-07-13 NOTE — Progress Notes (Signed)
Contacted via [place] morning Loretta Floyd, your labs have returned.  Cholesterol levels are still elevated, but coming down a little with you fasting.  I recommend at this time heavy focus on diet changes -- no medication at this time.  B12 level is improving, continue supplement.  Any questions? Keep being amazing!!  Thank you for allowing me to participate in your care.  I appreciate you. Kindest regards, Myla Mauriello

## 2022-07-15 NOTE — Telephone Encounter (Signed)
LVM asking patient to call back to schedule lab only visit at the end of November.

## 2022-08-04 ENCOUNTER — Telehealth: Payer: Self-pay

## 2022-08-04 NOTE — Telephone Encounter (Signed)
Called and LVM asking for patient to please return my call. Patient is overdue for mammogram and colon screening. Had Cologuard shipped, need to find out if this was completed. Can also call and schedule the patient's mammogram if she would like. Also find out if patient gets the flu vaccine.    OK for P[facility] to speak to patient and find out the above information if she calls back.

## 2022-10-11 ENCOUNTER — Telehealth: Payer: Self-pay

## 2022-10-11 NOTE — Telephone Encounter (Signed)
Tried calling patient to inquire about screenings, mammogram and colon cancer screening. Patient did not answer, will try to call again later.   OK for PEC to speak to patient if she calls back. Please find out if the patient would like to have her mammogram scheduled. If so, does she have a date and time preference? Please also ask if she still has the Cologuard kit at home. If so, please encourage patient to complete and return ASAP.

## 2023-01-02 NOTE — Patient Instructions (Incomplete)
Insomnia Insomnia is a sleep disorder that makes it difficult to fall asleep or stay asleep. Insomnia can cause fatigue, low energy, difficulty concentrating, mood swings, and poor performance at work or school. There are three different ways to classify insomnia: Difficulty falling asleep. Difficulty staying asleep. Waking up too early in the morning. Any type of insomnia can be long-term (chronic) or short-term (acute). Both are common. Short-term insomnia usually lasts for 3 months or less. Chronic insomnia occurs at least three times a week for longer than 3 months. What are the causes? Insomnia may be caused by another condition, situation, or substance, such as: Having certain mental health conditions, such as anxiety and depression. Using caffeine, alcohol, tobacco, or drugs. Having gastrointestinal conditions, such as gastroesophageal reflux disease (GERD). Having certain medical conditions. These include: Asthma. Alzheimer's disease. Stroke. Chronic pain. An overactive thyroid gland (hyperthyroidism). Other sleep disorders, such as restless legs syndrome and sleep apnea. Menopause. Sometimes, the cause of insomnia may not be known. What increases the risk? Risk factors for insomnia include: Gender. Females are affected more often than males. Age. Insomnia is more common as people get older. Stress and certain medical and mental health conditions. Lack of exercise. Having an irregular work schedule. This may include working night shifts and traveling between different time zones. What are the signs or symptoms? If you have insomnia, the main symptom is having trouble falling asleep or having trouble staying asleep. This may lead to other symptoms, such as: Feeling tired or having low energy. Feeling nervous about going to sleep. Not feeling rested in the morning. Having trouble concentrating. Feeling irritable, anxious, or depressed. How is this diagnosed? This condition  may be diagnosed based on: Your symptoms and medical history. Your health care provider may ask about: Your sleep habits. Any medical conditions you have. Your mental health. A physical exam. How is this treated? Treatment for insomnia depends on the cause. Treatment may focus on treating an underlying condition that is causing the insomnia. Treatment may also include: Medicines to help you sleep. Counseling or therapy. Lifestyle adjustments to help you sleep better. Follow these instructions at home: Eating and drinking  Limit or avoid alcohol, caffeinated beverages, and products that contain nicotine and tobacco, especially close to bedtime. These can disrupt your sleep. Do not eat a large meal or eat spicy foods right before bedtime. This can lead to digestive discomfort that can make it hard for you to sleep. Sleep habits  Keep a sleep diary to help you and your health care provider figure out what could be causing your insomnia. Write down: When you sleep. When you wake up during the night. How well you sleep and how rested you feel the next day. Any side effects of medicines you are taking. What you eat and drink. Make your bedroom a dark, comfortable place where it is easy to fall asleep. Put up shades or blackout curtains to block light from outside. Use a white noise machine to block noise. Keep the temperature cool. Limit screen use before bedtime. This includes: Not watching TV. Not using your smartphone, tablet, or computer. Stick to a routine that includes going to bed and waking up at the same times every day and night. This can help you fall asleep faster. Consider making a quiet activity, such as reading, part of your nighttime routine. Try to avoid taking naps during the day so that you sleep better at night. Get out of bed if you are still awake after   15 minutes of trying to sleep. Keep the lights down, but try reading or doing a quiet activity. When you feel  sleepy, go back to bed. General instructions Take over-the-counter and prescription medicines only as told by your health care provider. Exercise regularly as told by your health care provider. However, avoid exercising in the hours right before bedtime. Use relaxation techniques to manage stress. Ask your health care provider to suggest some techniques that may work well for you. These may include: Breathing exercises. Routines to release muscle tension. Visualizing peaceful scenes. Make sure that you drive carefully. Do not drive if you feel very sleepy. Keep all follow-up visits. This is important. Contact a health care provider if: You are tired throughout the day. You have trouble in your daily routine due to sleepiness. You continue to have sleep problems, or your sleep problems get worse. Get help right away if: You have thoughts about hurting yourself or someone else. Get help right away if you feel like you may hurt yourself or others, or have thoughts about taking your own life. Go to your nearest emergency room or: Call 911. Call the National Suicide Prevention Lifeline at 1-800-273-8255 or 988. This is open 24 hours a day. Text the Crisis Text Line at 741741. Summary Insomnia is a sleep disorder that makes it difficult to fall asleep or stay asleep. Insomnia can be long-term (chronic) or short-term (acute). Treatment for insomnia depends on the cause. Treatment may focus on treating an underlying condition that is causing the insomnia. Keep a sleep diary to help you and your health care provider figure out what could be causing your insomnia. This information is not intended to replace advice given to you by your health care provider. Make sure you discuss any questions you have with your health care provider. Document Revised: 11/02/2021 Document Reviewed: 11/02/2021 Elsevier Patient Education  2023 [employer] Inc.  

## 2023-01-07 ENCOUNTER — Ambulatory Visit: Payer: 59 | Admitting: Nurse Practitioner

## 2023-01-21 DIAGNOSIS — Z23 Encounter for immunization: Secondary | ICD-10-CM | POA: Diagnosis not present

## 2023-05-16 ENCOUNTER — Ambulatory Visit: Payer: 59 | Admitting: Nurse Practitioner

## 2023-06-04 NOTE — Patient Instructions (Signed)
Please call to schedule your mammogram and/or bone density: [facility] at [facility]  Address: [address], [place], [place] [address] Phone: [phone]  [facility] [address] [place],  [place]  [address] Phone: [phone]    We at [facility] are grateful that you chose [place] to provide care. We strive to provide excellent and compassionate care and are always looking for feedback. If you get a survey from the clinic please complete this.   Healthy Eating, Adult Healthy eating may help you get and keep a healthy body weight, reduce the risk of chronic disease, and live a long and productive life. It is important to follow a healthy eating pattern. Your nutritional and calorie needs should be met mainly by different nutrient-rich foods. What are tips for following this plan? Reading food labels Read labels and choose the following: Reduced or low sodium products. Juices with 100% fruit juice. Foods with low saturated fats (<3 g per serving) and high polyunsaturated and monounsaturated fats. Foods with whole grains, such as whole wheat, cracked wheat, brown rice, and wild rice. Whole grains that are fortified with folic acid. This is recommended for females who are pregnant or who want to become pregnant. Read labels and do not eat or drink the following: Foods or drinks with added sugars. These include foods that contain brown sugar, corn sweetener, corn syrup, dextrose, fructose, glucose, high-fructose corn syrup, honey, invert sugar, lactose, malt syrup, maltose, molasses, raw sugar, sucrose, trehalose, or turbinado sugar. Limit your intake of added sugars to less than 10% of your total daily calories. Do not eat more than the following amounts of added sugar per day: 6 teaspoons (25 g) for females. 9 teaspoons (38 g) for males. Foods that contain processed or refined starches and grains. Refined  grain products, such as white flour, degermed cornmeal, white bread, and white rice. Shopping Choose nutrient-rich snacks, such as vegetables, whole fruits, and nuts. Avoid high-calorie and high-sugar snacks, such as potato chips, fruit snacks, and candy. Use oil-based dressings and spreads on foods instead of solid fats such as butter, margarine, sour cream, or cream cheese. Limit pre-made sauces, mixes, and "instant" products such as flavored rice, instant noodles, and ready-made pasta. Try more plant-protein sources, such as tofu, tempeh, black beans, edamame, lentils, nuts, and seeds. Explore eating plans such as the Mediterranean diet or vegetarian diet. Try heart-healthy dips made with beans and healthy fats like hummus and guacamole. Vegetables go great with these. Cooking Use oil to saut or stir-fry foods instead of solid fats such as butter, margarine, or lard. Try baking, boiling, grilling, or broiling instead of frying. Remove the fatty part of meats before cooking. Steam vegetables in water or broth. Meal planning  At meals, imagine dividing your plate into fourths: One-half of your plate is fruits and vegetables. One-fourth of your plate is whole grains. One-fourth of your plate is protein, especially lean meats, poultry, eggs, tofu, beans, or nuts. Include low-fat dairy as part of your daily diet. Lifestyle Choose healthy options in all settings, including home, work, school, restaurants, or stores. Prepare your food safely: Wash your hands after handling raw meats. Where you prepare food, keep surfaces clean by regularly washing with hot, soapy water. Keep raw meats separate from ready-to-eat foods, such as fruits and vegetables. Cook seafood, meat, poultry, and eggs to the recommended temperature. Get a food thermometer. Store foods at safe temperatures. In general: Keep  cold foods at 33F (4.4C) or below. Keep hot foods at 133F (60C) or above. Keep your freezer at  [facility] (-17.8C) or below. Foods are not safe to eat if they have been between the temperatures of 40-133F (4.4-60C) for more than 2 hours. What foods should I eat? Fruits Aim to eat 1-2 cups of fresh, canned (in natural juice), or frozen fruits each day. One cup of fruit equals 1 small apple, 1 large banana, 8 large strawberries, 1 cup (237 g) canned fruit,  cup (82 g) dried fruit, or 1 cup (240 mL) 100% juice. Vegetables Aim to eat 2-4 cups of fresh and frozen vegetables each day, including different varieties and colors. One cup of vegetables equals 1 cup (91 g) broccoli or cauliflower florets, 2 medium carrots, 2 cups (150 g) raw, leafy greens, 1 large tomato, 1 large bell pepper, 1 large sweet potato, or 1 medium white potato. Grains Aim to eat 5-10 ounce-equivalents of whole grains each day. Examples of 1 ounce-equivalent of grains include 1 slice of bread, 1 cup (40 g) ready-to-eat cereal, 3 cups (24 g) popcorn, or  cup (93 g) cooked rice. Meats and other proteins Try to eat 5-7 ounce-equivalents of protein each day. Examples of 1 ounce-equivalent of protein include 1 egg,  oz nuts (12 almonds, 24 pistachios, or 7 walnut halves), 1/4 cup (90 g) cooked beans, 6 tablespoons (90 g) hummus or 1 tablespoon (16 g) peanut butter. A cut of meat or fish that is the size of a deck of cards is about 3-4 ounce-equivalents (85 g). Of the protein you eat each week, try to have at least 8 sounce (227 g) of seafood. This is about 2 servings per week. This includes salmon, trout, herring, sardines, and anchovies. Dairy Aim to eat 3 cup-equivalents of fat-free or low-fat dairy each day. Examples of 1 cup-equivalent of dairy include 1 cup (240 mL) milk, 8 ounces (250 g) yogurt, 1 ounces (44 g) natural cheese, or 1 cup (240 mL) fortified soy milk. Fats and oils Aim for about 5 teaspoons (21 g) of fats and oils per day. Choose monounsaturated fats, such as canola and olive oils, mayonnaise made with olive oil  or avocado oil, avocados, peanut butter, and most nuts, or polyunsaturated fats, such as sunflower, corn, and soybean oils, walnuts, pine nuts, sesame seeds, sunflower seeds, and flaxseed. Beverages Aim for 6 eight-ounce glasses of water per day. Limit coffee to 3-5 eight-ounce cups per day. Limit caffeinated beverages that have added calories, such as soda and energy drinks. If you drink alcohol: Limit how much you have to: 0-1 drink a day if you are female. 0-2 drinks a day if you are female. Know how much alcohol is in your drink. In the [place] one drink is one 12 oz bottle of beer (355 mL), one 5 oz glass of wine (148 mL), or one 1 oz glass of hard liquor (44 mL). Seasoning and other foods Try not to add too much salt to your food. Try using herbs and spices instead of salt. Try not to add sugar to food. This information is based on [place] nutrition guidelines. To learn more, visit DisposableN[employer].be. Exact amounts may vary. You may need different amounts. This information is not intended to replace advice given to you by your health care provider. Make sure you discuss any questions you have with your health care provider. Document Revised: 08/23/2022 Document Reviewed: 08/23/2022 Elsevier Patient Education  2024 [employer].

## 2023-06-07 ENCOUNTER — Ambulatory Visit
Admission: RE | Admit: 2023-06-07 | Discharge: 2023-06-07 | Disposition: A | Discharge status: Home / Self Care | Payer: 59 | Source: Ambulatory Visit | Attending: Nurse Practitioner | Admitting: Nurse Practitioner

## 2023-06-07 ENCOUNTER — Encounter: Payer: Self-pay | Admitting: Nurse Practitioner

## 2023-06-07 ENCOUNTER — Ambulatory Visit
Admission: RE | Admit: 2023-06-07 | Discharge: 2023-06-07 | Disposition: A | Discharge status: Home / Self Care | Payer: 59 | Attending: Nurse Practitioner | Admitting: Nurse Practitioner

## 2023-06-07 ENCOUNTER — Ambulatory Visit: Payer: 59 | Admitting: Nurse Practitioner

## 2023-06-07 VITALS — BP 107/73 | HR 66 | Temp 97.6°F

## 2023-06-07 DIAGNOSIS — Z23 Encounter for immunization: Secondary | ICD-10-CM | POA: Diagnosis not present

## 2023-06-07 DIAGNOSIS — Z1211 Encounter for screening for malignant neoplasm of colon: Secondary | ICD-10-CM

## 2023-06-07 DIAGNOSIS — F321 Major depressive disorder, single episode, moderate: Secondary | ICD-10-CM

## 2023-06-07 DIAGNOSIS — Z1231 Encounter for screening mammogram for malignant neoplasm of breast: Secondary | ICD-10-CM | POA: Diagnosis not present

## 2023-06-07 DIAGNOSIS — F5101 Primary insomnia: Secondary | ICD-10-CM | POA: Diagnosis not present

## 2023-06-07 DIAGNOSIS — S52502A Unspecified fracture of the lower end of left radius, initial encounter for closed fracture: Secondary | ICD-10-CM

## 2023-06-07 DIAGNOSIS — M25532 Pain in left wrist: Secondary | ICD-10-CM

## 2023-06-07 DIAGNOSIS — D518 Other vitamin B12 deficiency anemias: Secondary | ICD-10-CM

## 2023-06-07 DIAGNOSIS — N951 Menopausal and female climacteric states: Secondary | ICD-10-CM | POA: Diagnosis not present

## 2023-06-07 DIAGNOSIS — E7800 Pure hypercholesterolemia, unspecified: Secondary | ICD-10-CM

## 2023-06-07 MED ORDER — ESTRADIOL 1 MG PO TABS: 1.0000 mg | ORAL_TABLET | Freq: Every day | ORAL | 4 refills | Status: AC

## 2023-06-07 NOTE — Assessment & Plan Note (Addendum)
Acute, noted on imaging today.  Had fall one week ago and landed on left wrist with point tenderness present.  She has area wrapped with ACE bandage.  Discussed with patient on telephone after results returned and urgent ortho referral placed to further assess and obtain recommendations.  Consider DEXA prior to 63 due to recent fracture and risk factors.

## 2023-06-07 NOTE — Assessment & Plan Note (Signed)
Followed by psychiatry, continue this collaboration and current medication regimen as prescribed by them. 

## 2023-06-07 NOTE — Assessment & Plan Note (Signed)
Has history of hysterectomy years ago.  Still has ovaries.  Vaginal estrace offering her no benefit to symptoms, but she tried a friends oral estrogen 1 MG which is helping.  Discussed with patient at length risks and benefits of hormone replacement therapy + discussed this is not long term treatment -- would discontinue prior to age 62.  Start oral estrace 1 MG daily.  Monitor closely.

## 2023-06-07 NOTE — Progress Notes (Signed)
Spoke to patient on phone, ortho referral in

## 2023-06-07 NOTE — Assessment & Plan Note (Signed)
Refer to radial fracture plan of care.

## 2023-06-07 NOTE — Assessment & Plan Note (Signed)
Chronic, ongoing since loss of her daughter years ago.  Followed by psychiatry and therapy, continue this collaboration and current medication regimen as prescribed by them.  Suspect much of fatigue comes from loss of daughter and ongoing depression. 

## 2023-06-07 NOTE — Progress Notes (Signed)
BP 107/73   Pulse 66   Temp 97.6 F (36.4 C) (Oral)   LMP  (LMP Unknown)    Subjective:    Patient ID: Loretta Floyd, female    DOB: [birth year], 62 y.o.   MRN: [id number]  HPI: Loretta Floyd is a 62 y.o. female  Chief Complaint  Patient presents with   New Med Request    Patient wishes to speak to you about estrogen pills.   DEPRESSION Lost daughter >4 years ago,  continues with therapy, has been attending for some time. She does endorse depressed mood.  She continues on Adderall which she takes only one a day, Sertraline daily, and Trazodone PRN.  Is followed by Dr. Lafayette Dragon for psychiatry in [place] -- visits with every 6 months.   Mood status: uncontrolled Symptom severity: moderate  Duration of current treatment : chronic Side effects: no Medication compliance: good compliance Psychotherapy/counseling: yes in the past Depressed mood: yes Anxious mood: yes Anhedonia: yes Significant weight loss or gain: no Insomnia: yes hard to fall asleep Fatigue: yes Feelings of worthlessness or guilt: occcasional Impaired concentration/indecisiveness: no Suicidal ideations: no Hopelessness: yes Crying spells: yes    06/07/2023    4:59 PM 06/09/2022    2:40 PM  Depression screen PHQ 2/9  Decreased Interest 2 3  Down, Depressed, Hopeless 1 2  PHQ - 2 Score 3 5  Altered sleeping 1 0  Tired, decreased energy 1 3  Change in appetite 0 0  Feeling bad or failure about yourself  1 1  Trouble concentrating 1 1  Moving slowly or fidgety/restless 0 0  Suicidal thoughts 0 0  PHQ-9 Score 7 10  Difficult doing work/chores  Very difficult       06/07/2023    5:00 PM 06/09/2022    2:41 PM  GAD 7 : Generalized Anxiety Score  Nervous, Anxious, on Edge 2 2  Control/stop worrying 2 2  Worry too much - different things 1 2  Trouble relaxing 0 0  Restless 0 0  Easily annoyed or irritable 0 0  Afraid - awful might happen 0 1  Total GAD 7 Score 5 7  Anxiety Difficulty Not difficult at  all Somewhat difficult   WRIST PAIN Tripped and fell on left wrist last Thursday.   Is right hand dominant. Duration: days Involved wrist: left Mechanism of injury:  fell and wrist went backwards Location: diffuse Onset: gradual Severity: 9/10  Quality:  sharp, dull, aching, and throbbing Frequency: intermittent Radiation: no Aggravating factors: movement  Alleviating factors: NSAIDs , ice and heat + wrapping area Status: stable Treatments attempted: as above    Relief with NSAIDs?:  moderate Weakness: yes Numbness: none Redness: no Bruising: no Swelling: yes Fevers: no   MENOPAUSAL SYMPTOMS Was taking vaginal estrace with no benefit.  Started taking a friends 1 MG estrogen (estrace) and this has helped Floyd symptoms a lot. Gravida/Para: 4/3 Duration: uncontrolled Symptom severity: moderate Hot flashes: yes Night sweats: yes Sleep disturbances: no Vaginal dryness: yes Dyspareunia: yes Decreased libido: yes Emotional lability: yes Stress incontinence: no Previous HRT/pharmacotherapy: yes Hysterectomy: yes -- does have ovaries -- hysterectomy 6-7 years Absolute Contraindications to Hormonal Therapy:     Undiagnosed vaginal bleeding: no    Breast cancer: no    Endometrial cancer: no    Coronary disease: no    Cerebrovascular disease: no    Venous thromboembolic disease: no   Relevant past medical, surgical, family and social history reviewed and updated  as indicated. Interim medical history since our last visit reviewed. Allergies and medications reviewed and updated.  Review of Systems  Constitutional:  Negative for activity change, appetite change, diaphoresis, fatigue and fever.  Respiratory:  Negative for cough, chest tightness and shortness of breath.   Cardiovascular:  Negative for chest pain, palpitations and leg swelling.  Gastrointestinal: Negative.   Musculoskeletal:  Positive for arthralgias.  Neurological: Negative.   Psychiatric/Behavioral: Negative.       Per HPI unless specifically indicated above     Objective:    BP 107/73   Pulse 66   Temp 97.6 F (36.4 C) (Oral)   LMP  (LMP Unknown)   Wt Readings from Last 3 Encounters:  07/06/22 110 lb 3.2 oz (50 kg)  06/09/22 113 lb 12.8 oz (51.6 kg)  12/31/16 115 lb (52.2 kg)    Physical Exam Vitals and nursing note reviewed.  Constitutional:      General: She is awake. She is not in acute distress.    Appearance: She is well-developed and well-groomed. She is not ill-appearing or toxic-appearing.  HENT:     Head: Normocephalic.     Right Ear: Hearing and external ear normal.     Left Ear: Hearing and external ear normal.  Eyes:     General: Lids are normal.        Right eye: No discharge.        Left eye: No discharge.     Conjunctiva/sclera: Conjunctivae normal.     Pupils: Pupils are equal, round, and reactive to light.  Neck:     Thyroid: No thyromegaly.     Vascular: No carotid bruit.  Cardiovascular:     Rate and Rhythm: Normal rate and regular rhythm.     Heart sounds: Normal heart sounds. No murmur heard.    No gallop.  Pulmonary:     Effort: Pulmonary effort is normal. No accessory muscle usage or respiratory distress.     Breath sounds: Normal breath sounds.  Abdominal:     General: Bowel sounds are normal.     Palpations: Abdomen is soft.  Musculoskeletal:     Right wrist: Normal.     Left wrist: Swelling (mild present), tenderness (to bilateral sides of wrist) and bony tenderness (to radial side) present. No snuff box tenderness or crepitus. Decreased range of motion (decreased flexion and extension due to pain).     Cervical back: Normal range of motion and neck supple.     Right lower leg: No edema.     Left lower leg: No edema.  Lymphadenopathy:     Cervical: No cervical adenopathy.  Skin:    General: Skin is warm and dry.  Neurological:     Mental Status: She is alert and oriented to person, place, and time.     Deep Tendon Reflexes: Reflexes are  normal and symmetric.  Psychiatric:        Attention and Perception: Attention normal.        Mood and Affect: Mood normal.        Speech: Speech normal.        Behavior: Behavior normal. Behavior is cooperative.        Thought Content: Thought content normal.    Results for orders placed or performed in visit on 07/12/22  Vitamin B12  Result Value Ref Range   Vitamin B-12 761 232 - 1,245 pg/mL  Lipid Panel w/o Chol/HDL Ratio  Result Value Ref Range   Cholesterol, Total 270 (  H) 100 - 199 mg/dL   Triglycerides 161 (H) 0 - 149 mg/dL   HDL 62 >09 mg/dL   VLDL Cholesterol Cal 28 5 - 40 mg/dL   LDL Chol Calc (NIH) 604 (H) 0 - 99 mg/dL      Assessment & Plan:   Problem List Items Addressed This Visit       Musculoskeletal and Integument   Closed fracture of left distal radius    Acute, noted on imaging today.  Had fall one week ago and landed on left wrist with point tenderness present.  She has area wrapped with ACE bandage.  Discussed with patient on telephone after results returned and urgent ortho referral placed to further assess and obtain recommendations.  Consider DEXA prior to 65 due to recent fracture and risk factors.      Relevant Orders   Ambulatory referral to Orthopedic Surgery     Other   Depression, major, single episode, moderate (HCC) - Primary    Chronic, ongoing since loss of Floyd daughter years ago.  Followed by psychiatry and therapy, continue this collaboration and current medication regimen as prescribed by them.  Suspect much of fatigue comes from loss of daughter and ongoing depression.      Insomnia    Followed by psychiatry, continue this collaboration and current medication regimen as prescribed by them.      Left wrist pain    Refer to radial fracture plan of care.      Relevant Orders   DG Wrist Complete Left (Completed)   Menopausal symptoms    Has history of hysterectomy years ago.  Still has ovaries.  Vaginal estrace offering Floyd no  benefit to symptoms, but she tried a friends oral estrogen 1 MG which is helping.  Discussed with patient at length risks and benefits of hormone replacement therapy + discussed this is not long term treatment -- would discontinue prior to age 60.  Start oral estrace 1 MG daily.  Monitor closely.      RESOLVED: Need for Td vaccine   Relevant Orders   Td vaccine greater than or equal to 7yo preservative free IM (Completed)   Other Visit Diagnoses     Breast cancer screening by mammogram       Mammogram ordered and instructed on how to schedule.   Relevant Orders   MM 3D SCREENING MAMMOGRAM BILATERAL BREAST        Follow up plan: Return in about 2 months (around 08/08/2023) for Annual physical with fasting labs.

## 2023-06-10 ENCOUNTER — Encounter: Payer: Self-pay | Admitting: Nurse Practitioner

## 2023-06-11 DIAGNOSIS — S52502A Unspecified fracture of the lower end of left radius, initial encounter for closed fracture: Secondary | ICD-10-CM | POA: Diagnosis not present

## 2023-06-16 DIAGNOSIS — S52502D Unspecified fracture of the lower end of left radius, subsequent encounter for closed fracture with routine healing: Secondary | ICD-10-CM | POA: Diagnosis not present

## 2023-06-21 DIAGNOSIS — S52502D Unspecified fracture of the lower end of left radius, subsequent encounter for closed fracture with routine healing: Secondary | ICD-10-CM | POA: Diagnosis not present

## 2023-06-23 DIAGNOSIS — G8918 Other acute postprocedural pain: Secondary | ICD-10-CM | POA: Diagnosis not present

## 2023-06-23 DIAGNOSIS — S52532A Colles' fracture of left radius, initial encounter for closed fracture: Secondary | ICD-10-CM | POA: Diagnosis not present

## 2023-06-23 DIAGNOSIS — S52572A Other intraarticular fracture of lower end of left radius, initial encounter for closed fracture: Secondary | ICD-10-CM | POA: Diagnosis not present

## 2023-06-28 DIAGNOSIS — S52502D Unspecified fracture of the lower end of left radius, subsequent encounter for closed fracture with routine healing: Secondary | ICD-10-CM | POA: Diagnosis not present

## 2023-07-06 DIAGNOSIS — Z4889 Encounter for other specified surgical aftercare: Secondary | ICD-10-CM | POA: Diagnosis not present

## 2023-07-07 DIAGNOSIS — S52502D Unspecified fracture of the lower end of left radius, subsequent encounter for closed fracture with routine healing: Secondary | ICD-10-CM | POA: Diagnosis not present

## 2023-07-12 ENCOUNTER — Other Ambulatory Visit: Payer: Self-pay | Admitting: Family Medicine

## 2023-07-12 DIAGNOSIS — Z1231 Encounter for screening mammogram for malignant neoplasm of breast: Secondary | ICD-10-CM

## 2023-07-21 DIAGNOSIS — S52502D Unspecified fracture of the lower end of left radius, subsequent encounter for closed fracture with routine healing: Secondary | ICD-10-CM | POA: Diagnosis not present

## 2023-08-24 DIAGNOSIS — F3342 Major depressive disorder, recurrent, in full remission: Secondary | ICD-10-CM | POA: Diagnosis not present

## 2023-08-24 DIAGNOSIS — F5101 Primary insomnia: Secondary | ICD-10-CM | POA: Diagnosis not present

## 2023-08-24 DIAGNOSIS — F900 Attention-deficit hyperactivity disorder, predominantly inattentive type: Secondary | ICD-10-CM | POA: Diagnosis not present

## 2023-08-25 DIAGNOSIS — Z4889 Encounter for other specified surgical aftercare: Secondary | ICD-10-CM | POA: Diagnosis not present

## 2023-08-27 DIAGNOSIS — Z8781 Personal history of (healed) traumatic fracture: Secondary | ICD-10-CM | POA: Insufficient documentation

## 2023-08-30 ENCOUNTER — Encounter: Payer: 59 | Admitting: Nurse Practitioner

## 2023-08-30 DIAGNOSIS — F5101 Primary insomnia: Secondary | ICD-10-CM

## 2023-08-30 DIAGNOSIS — D518 Other vitamin B12 deficiency anemias: Secondary | ICD-10-CM

## 2023-08-30 DIAGNOSIS — E7800 Pure hypercholesterolemia, unspecified: Secondary | ICD-10-CM

## 2023-08-30 DIAGNOSIS — N951 Menopausal and female climacteric states: Secondary | ICD-10-CM

## 2023-08-30 DIAGNOSIS — Z0000 Encounter for general adult medical examination without abnormal findings: Secondary | ICD-10-CM

## 2023-08-30 DIAGNOSIS — F321 Major depressive disorder, single episode, moderate: Secondary | ICD-10-CM

## 2023-08-30 DIAGNOSIS — Z1211 Encounter for screening for malignant neoplasm of colon: Secondary | ICD-10-CM

## 2023-10-08 DIAGNOSIS — Z0000 Encounter for general adult medical examination without abnormal findings: Secondary | ICD-10-CM | POA: Insufficient documentation

## 2023-10-10 ENCOUNTER — Encounter: Payer: 59 | Admitting: Nurse Practitioner

## 2023-10-10 DIAGNOSIS — Z1211 Encounter for screening for malignant neoplasm of colon: Secondary | ICD-10-CM

## 2023-10-10 DIAGNOSIS — D518 Other vitamin B12 deficiency anemias: Secondary | ICD-10-CM

## 2023-10-10 DIAGNOSIS — N951 Menopausal and female climacteric states: Secondary | ICD-10-CM

## 2023-10-10 DIAGNOSIS — E7800 Pure hypercholesterolemia, unspecified: Secondary | ICD-10-CM

## 2023-10-10 DIAGNOSIS — F5101 Primary insomnia: Secondary | ICD-10-CM

## 2023-10-10 DIAGNOSIS — F321 Major depressive disorder, single episode, moderate: Secondary | ICD-10-CM

## 2023-10-10 DIAGNOSIS — Z0000 Encounter for general adult medical examination without abnormal findings: Secondary | ICD-10-CM

## 2023-10-10 DIAGNOSIS — Z23 Encounter for immunization: Secondary | ICD-10-CM

## 2024-02-15 DIAGNOSIS — F3342 Major depressive disorder, recurrent, in full remission: Secondary | ICD-10-CM | POA: Diagnosis not present

## 2024-02-15 DIAGNOSIS — F900 Attention-deficit hyperactivity disorder, predominantly inattentive type: Secondary | ICD-10-CM | POA: Diagnosis not present

## 2024-02-15 DIAGNOSIS — F5101 Primary insomnia: Secondary | ICD-10-CM | POA: Diagnosis not present

## 2024-06-10 ENCOUNTER — Encounter: Payer: Self-pay | Admitting: Nurse Practitioner

## 2024-06-11 NOTE — Telephone Encounter (Signed)
 Got her scheduled with [facility], She wanted to get in earlier than August 20th

## 2024-06-17 DIAGNOSIS — J209 Acute bronchitis, unspecified: Secondary | ICD-10-CM | POA: Diagnosis not present

## 2024-09-07 DIAGNOSIS — F900 Attention-deficit hyperactivity disorder, predominantly inattentive type: Secondary | ICD-10-CM | POA: Diagnosis not present

## 2024-09-07 DIAGNOSIS — F5101 Primary insomnia: Secondary | ICD-10-CM | POA: Diagnosis not present

## 2024-09-07 DIAGNOSIS — F3342 Major depressive disorder, recurrent, in full remission: Secondary | ICD-10-CM | POA: Diagnosis not present
